# Patient Record
Sex: Female | Born: 1970 | Race: White | Hispanic: No | Smoking: Current some day smoker
Health system: Southern US, Community
[De-identification: ages and names within clinical notes are randomized; demographics above are authoritative.]

## PROBLEM LIST (undated history)

## (undated) DIAGNOSIS — M722 Plantar fascial fibromatosis: Secondary | ICD-10-CM

## (undated) DIAGNOSIS — C50919 Malignant neoplasm of unspecified site of unspecified female breast: Secondary | ICD-10-CM

## (undated) DIAGNOSIS — E039 Hypothyroidism, unspecified: Secondary | ICD-10-CM

## (undated) DIAGNOSIS — Z806 Family history of leukemia: Secondary | ICD-10-CM

## (undated) DIAGNOSIS — E079 Disorder of thyroid, unspecified: Secondary | ICD-10-CM

## (undated) DIAGNOSIS — F419 Anxiety disorder, unspecified: Secondary | ICD-10-CM

## (undated) DIAGNOSIS — E162 Hypoglycemia, unspecified: Secondary | ICD-10-CM

## (undated) DIAGNOSIS — G43909 Migraine, unspecified, not intractable, without status migrainosus: Secondary | ICD-10-CM

## (undated) HISTORY — DX: Plantar fascial fibromatosis: M72.2

## (undated) HISTORY — PX: NO PAST SURGERIES: SHX2092

## (undated) HISTORY — DX: Disorder of thyroid, unspecified: E07.9

## (undated) HISTORY — DX: Family history of leukemia: Z80.6

## (undated) HISTORY — DX: Anxiety disorder, unspecified: F41.9

## (undated) HISTORY — DX: Hypoglycemia, unspecified: E16.2

## (undated) HISTORY — PX: BREAST BIOPSY: SHX20

## (undated) HISTORY — DX: Migraine, unspecified, not intractable, without status migrainosus: G43.909

---

## 2013-05-03 ENCOUNTER — Encounter: Payer: Self-pay | Admitting: Certified Nurse Midwife

## 2013-05-03 ENCOUNTER — Ambulatory Visit (INDEPENDENT_AMBULATORY_CARE_PROVIDER_SITE_OTHER): Payer: BC Managed Care – PPO | Admitting: Certified Nurse Midwife

## 2013-05-03 VITALS — BP 120/78 | HR 68 | Resp 16 | Ht 61.75 in | Wt 171.0 lb

## 2013-05-03 DIAGNOSIS — Z0000 Encounter for general adult medical examination without abnormal findings: Secondary | ICD-10-CM

## 2013-05-03 DIAGNOSIS — Z01419 Encounter for gynecological examination (general) (routine) without abnormal findings: Secondary | ICD-10-CM

## 2013-05-03 DIAGNOSIS — E039 Hypothyroidism, unspecified: Secondary | ICD-10-CM | POA: Insufficient documentation

## 2013-05-03 LAB — LIPID PANEL
CHOL/HDL RATIO: 2.1 ratio
Cholesterol: 164 mg/dL (ref 0–200)
HDL: 80 mg/dL (ref >39)
LDL CALC: 69 mg/dL (ref 0–99)
Triglycerides: 77 mg/dL (ref <150)
VLDL: 15 mg/dL (ref 0–40)

## 2013-05-03 LAB — THYROID PANEL WITH TSH
Free Thyroxine Index: 0.6 — ABNORMAL LOW (ref 1.0–3.9)
T3 UPTAKE: 30.8 % (ref 22.5–37.0)
T4 TOTAL: 1.9 ug/dL — AB (ref 5.0–12.5)
TSH: 83.858 u[IU]/mL — ABNORMAL HIGH (ref 0.350–4.500)

## 2013-05-03 LAB — HEMOGLOBIN, FINGERSTICK: HEMOGLOBIN, FINGERSTICK: 13.1 g/dL (ref 12.0–16.0)

## 2013-05-03 NOTE — Patient Instructions (Signed)

## 2013-05-03 NOTE — Progress Notes (Signed)
43 y.o. G1P1001 Single Caucasian Fe here to establish gyn care and for annual exam.Periods normal no issues. No contraception use female/female relationship. Hypothyroid currently not on medication due to move to area for the past year. Patient symptomatic with fatigue and hair loss and weight change.Patient was on Synthroid. Patient would like to level check to see what dosage she needs now. Previous history of migraine with aura, but has not had one in several years. Used OTC medication for relief. Concerns with PMS issue in past few months, feels like it might be my thyroid instead. Denies anxiety issues. Sees Urgent care prn.  Patient's last menstrual period was 04/26/2013.          Sexually active: yes  The current method of family planning is none.    Exercising: no  exercise Smoker:  yes  Health Maintenance: Pap:  2013 normal per patient No abnormal paps MMG:  none Colonoscopy:  none BMD:   none TDaP: unsure Labs: Hgb-13.1   reports that she has been smoking.  She does not have any smokeless tobacco history on file. She reports that she drinks about 1.5 - 2 ounces of alcohol per week. She reports that she does not use illicit drugs.  Past Medical History  Diagnosis Date  . Migraines   . Anxiety   . Thyroid disease     hypothyroid  . Hypoglycemia     History reviewed. No pertinent past surgical history.  No current outpatient prescriptions on file.   No current facility-administered medications for this visit.    Family History  Problem Relation Age of Onset  . Thyroid disease Father   . Heart attack Maternal Aunt   . Heart disease Maternal Grandmother     ROS:  Pertinent items are noted in HPI.  Otherwise, a comprehensive ROS was negative.  Exam:   BP 120/78  Pulse 68  Resp 16  Ht 5' 1.75" (1.568 m)  Wt 171 lb (77.565 kg)  BMI 31.55 kg/m2  LMP 04/26/2013 Height: 5' 1.75" (156.8 cm)  Ht Readings from Last 3 Encounters:  05/03/13 5' 1.75" (1.568 m)     General appearance: alert, cooperative and appears stated age Head: Normocephalic, without obvious abnormality, atraumatic Neck: no adenopathy, supple, symmetrical, trachea midline and thyroid mild enlargement, no nodules palpated Lungs: clear to auscultation bilaterally Breasts: normal appearance, no masses or tenderness, No nipple retraction or dimpling, No nipple discharge or bleeding, No axillary or supraclavicular adenopathy Heart: regular rate and rhythm Abdomen: soft, non-tender; no masses,  no organomegaly Extremities: extremities normal, atraumatic, no cyanosis or edema Skin: Skin color, texture, turgor normal. No rashes or lesions Lymph nodes: Cervical, supraclavicular, and axillary nodes normal. No abnormal inguinal nodes palpated Neurologic: Grossly normal   Pelvic: External genitalia:  no lesions              Urethra:  normal appearing urethra with no masses, tenderness or lesions              Bartholin's and Skene's: normal                 Vagina: normal appearing vagina with normal color and discharge, no lesions              Cervix: normal, non tender              Pap taken: yes Bimanual Exam:  Uterus:  normal size, contour, position, consistency, mobility, non-tender and anteverted  Adnexa: normal adnexa and no mass, fullness, tenderness               Rectovaginal: Confirms               Anus:  normal sphincter tone, no lesions  A:  Well Woman with normal exam  Contraception none needed female/female relationship  Hypothyroid off medication due no established care  History of anxiety in past no medication needed now  History of migraine no aura with OTC medication use only  ?PMS  P:   Reviewed health and wellness pertinent to exam  Discussed obtaining TSH and thyroid panel to assess status and then will work with dosage. May need to sue Endocrine if problems. Discussed PMS, may be thyroid and will address PMS if indicated. Patient agreeable with  plan.  Labs:Lipid panel, TSH with thyroid panel, Vit.D  Pap smear as per guidelines   Mammogram yearly, given information to schedule first one pap smear taken today with HPVHR  counseled on breast self exam, mammography screening, adequate intake of calcium and vitamin D, diet and exercise  return annually or prn  An After Visit Summary was printed and given to the patient.

## 2013-05-04 LAB — VITAMIN D 25 HYDROXY (VIT D DEFICIENCY, FRACTURES): Vit D, 25-Hydroxy: 29 ng/mL — ABNORMAL LOW (ref 30–89)

## 2013-05-05 LAB — IPS PAP TEST WITH HPV

## 2013-05-06 ENCOUNTER — Telehealth: Payer: Self-pay

## 2013-05-06 ENCOUNTER — Other Ambulatory Visit: Payer: Self-pay | Admitting: Certified Nurse Midwife

## 2013-05-06 DIAGNOSIS — E039 Hypothyroidism, unspecified: Secondary | ICD-10-CM

## 2013-05-06 MED ORDER — LEVOTHYROXINE SODIUM 125 MCG PO TABS: 125.0000 ug | ORAL_TABLET | Freq: Every day | ORAL | Status: DC

## 2013-05-06 NOTE — Telephone Encounter (Signed)
Patient notified of results. See lab 

## 2013-05-06 NOTE — Telephone Encounter (Signed)
lmtcb

## 2013-05-06 NOTE — Telephone Encounter (Signed)
Message copied by Susy Manor on Fri May 06, 2013  3:54 PM ------      Message from: Regina Eck      Created: Fri May 06, 2013  8:35 AM       Notify patient Vitamin D low, needs to start on OTC Vit.D3 1000 IU daily        TSH abnormal as we suspected, which is probably contributing to her symptoms  Needs to start on RX Synthroid 125 mcg daily (per consult with Dr. Sabra Heck) sent order to pharmacy and recheck in 2 weeks order in      Cholesterol normal HDL great, and LDL normal, Triglycerides normal      Pap smear normal, HPVHR not detected 02 ------

## 2013-05-13 NOTE — Progress Notes (Signed)
Reviewed personally.  M. Suzanne Tramel Westbrook, MD.  

## 2013-05-20 ENCOUNTER — Other Ambulatory Visit (INDEPENDENT_AMBULATORY_CARE_PROVIDER_SITE_OTHER): Payer: BC Managed Care – PPO

## 2013-05-20 DIAGNOSIS — E039 Hypothyroidism, unspecified: Secondary | ICD-10-CM

## 2013-05-21 LAB — THYROID PANEL WITH TSH
Free Thyroxine Index: 3.9 (ref 1.0–3.9)
T3 Uptake: 40.3 % — ABNORMAL HIGH (ref 22.5–37.0)
T4 TOTAL: 9.8 ug/dL (ref 5.0–12.5)
TSH: 16.878 u[IU]/mL — ABNORMAL HIGH (ref 0.350–4.500)

## 2013-05-23 ENCOUNTER — Telehealth: Payer: Self-pay

## 2013-05-23 ENCOUNTER — Other Ambulatory Visit: Payer: Self-pay | Admitting: Certified Nurse Midwife

## 2013-05-23 DIAGNOSIS — E039 Hypothyroidism, unspecified: Secondary | ICD-10-CM

## 2013-05-23 NOTE — Telephone Encounter (Signed)
Patient notified of results as written by provider 

## 2013-05-23 NOTE — Telephone Encounter (Signed)
lmtcb

## 2013-05-23 NOTE — Telephone Encounter (Signed)
Returning call. Ok to call work number [phone].

## 2013-05-23 NOTE — Telephone Encounter (Signed)
Message copied by Susy Manor on Mon May 23, 2013  9:56 AM ------      Message from: Regina Eck      Created: Mon May 23, 2013  8:55 AM       Notify patient thyroid panel much better. Continue current dosage. Recheck in one month. Order in      Patient status? ------

## 2013-06-24 ENCOUNTER — Other Ambulatory Visit: Payer: BC Managed Care – PPO

## 2013-07-01 ENCOUNTER — Ambulatory Visit (INDEPENDENT_AMBULATORY_CARE_PROVIDER_SITE_OTHER): Payer: BC Managed Care – PPO | Admitting: *Deleted

## 2013-07-01 DIAGNOSIS — E039 Hypothyroidism, unspecified: Secondary | ICD-10-CM

## 2013-07-01 LAB — THYROID PANEL WITH TSH
FREE THYROXINE INDEX: 4.6 — AB (ref 1.0–3.9)
T3 Uptake: 43.4 % — ABNORMAL HIGH (ref 22.5–37.0)
T4, Total: 10.5 ug/dL (ref 5.0–12.5)
TSH: 0.501 u[IU]/mL (ref 0.350–4.500)

## 2013-07-03 ENCOUNTER — Other Ambulatory Visit: Payer: Self-pay | Admitting: Certified Nurse Midwife

## 2013-07-04 NOTE — Telephone Encounter (Signed)
Patient to continue with dose? Last TSH check 07/01/13

## 2013-07-05 MED ORDER — LEVOTHYROXINE SODIUM 125 MCG PO TABS: 125.0000 ug | ORAL_TABLET | Freq: Every day | ORAL | Status: DC

## 2013-07-05 NOTE — Telephone Encounter (Signed)
Patient to continue current dose and recheck in 6 weeks Order in for labs and rx

## 2013-07-06 ENCOUNTER — Telehealth: Payer: Self-pay

## 2013-07-06 NOTE — Telephone Encounter (Signed)
Patient notified of results. See lab 

## 2013-07-06 NOTE — Telephone Encounter (Signed)
lmtcb

## 2013-07-06 NOTE — Telephone Encounter (Signed)
Called pt and was notified. Pt made Lab appt for 08/12/13 @3 .30p Rx sent.

## 2013-07-06 NOTE — Telephone Encounter (Signed)
Message copied by Susy Manor on Wed Jul 06, 2013 12:56 PM ------      Message from: Regina Eck      Created: Tue Jul 05, 2013  6:39 PM       Notify patient that profile looking much better. Would like to keep on current dosage and recheck in 6 weeks. Order in for medication and labs ------

## 2013-08-12 ENCOUNTER — Ambulatory Visit (INDEPENDENT_AMBULATORY_CARE_PROVIDER_SITE_OTHER): Payer: BC Managed Care – PPO

## 2013-08-12 DIAGNOSIS — E039 Hypothyroidism, unspecified: Secondary | ICD-10-CM

## 2013-08-13 LAB — THYROID PANEL WITH TSH
Free Thyroxine Index: 4.8 — ABNORMAL HIGH (ref 1.0–3.9)
T3 UPTAKE: 42.5 % — AB (ref 22.5–37.0)
T4, Total: 11.3 ug/dL (ref 5.0–12.5)
TSH: 0.187 u[IU]/mL — AB (ref 0.350–4.500)

## 2013-08-17 ENCOUNTER — Telehealth: Payer: Self-pay

## 2013-08-17 NOTE — Telephone Encounter (Signed)
Message copied by Susy Manor on Wed Aug 17, 2013 11:52 AM ------      Message from: Regina Eck      Created: Wed Aug 17, 2013  8:45 AM       Notify patient that her TSH is low and no thyroid medication at this time      Your T3 and Free Thyroxine are slightly high as result of TSH being low      T4 normal      Needs repeat in one month to see if normalized order in ------

## 2013-08-17 NOTE — Telephone Encounter (Signed)
lmtcb

## 2013-08-18 NOTE — Telephone Encounter (Signed)
Patient notified of results as written by provider 

## 2013-08-18 NOTE — Telephone Encounter (Signed)
Patient is returning call to joy

## 2013-08-31 ENCOUNTER — Other Ambulatory Visit: Payer: Self-pay

## 2013-08-31 DIAGNOSIS — E039 Hypothyroidism, unspecified: Secondary | ICD-10-CM

## 2013-08-31 NOTE — Telephone Encounter (Signed)
Last AEX:05/03/13 Last refill:07/06/13 #30 X 1 Current AEX:08/26/14  Next Lab appt: 09/16/13   Message from: Regina Eck  Created: Wed Aug 17, 2013 8:45 AM  Notify patient that her TSH is low and no thyroid medication at this time  Your T3 and Free Thyroxine are slightly high as result of TSH being low  T4 normal  Needs repeat in one month to see if normalized order in

## 2013-09-01 ENCOUNTER — Other Ambulatory Visit: Payer: Self-pay | Admitting: Certified Nurse Midwife

## 2013-09-01 NOTE — Telephone Encounter (Signed)
Last AEX 05/03/13 Last refill 07/05/13 #30/1 refill Last TSH check 08/12/13  Please approve or deny Rx.

## 2013-09-05 MED ORDER — LEVOTHYROXINE SODIUM 125 MCG PO TABS: 125.0000 ug | ORAL_TABLET | Freq: Every day | ORAL | Status: DC

## 2013-09-16 ENCOUNTER — Ambulatory Visit (INDEPENDENT_AMBULATORY_CARE_PROVIDER_SITE_OTHER): Payer: BC Managed Care – PPO

## 2013-09-16 DIAGNOSIS — E039 Hypothyroidism, unspecified: Secondary | ICD-10-CM

## 2013-09-16 LAB — TSH: TSH: 2.616 u[IU]/mL (ref 0.350–4.500)

## 2013-09-20 ENCOUNTER — Telehealth: Payer: Self-pay

## 2013-09-20 NOTE — Telephone Encounter (Signed)
Message copied by Susy Manor on Tue Sep 20, 2013  2:34 PM ------      Message from: Regina Eck      Created: Tue Sep 20, 2013 12:00 PM       Notify patient that her TSH looks good and has leveled out. Will keep at same dosage. Recheck 4 months order in. Does she need refill on her medication? ------

## 2013-09-20 NOTE — Telephone Encounter (Signed)
Unable to leave message on answering machine. Try again

## 2013-09-29 NOTE — Telephone Encounter (Signed)
Patient notified of results. See lab 

## 2013-10-12 ENCOUNTER — Telehealth: Payer: Self-pay | Admitting: Certified Nurse Midwife

## 2013-10-12 NOTE — Telephone Encounter (Signed)
Spoke with patient. Results given as seen below. Patient is agreeable. Patient has one refill of medication and will call back when she needs another refill. Four month recheck of TSH scheduled for 1/22 at 3:30pm. Patient agreeable to date and time.  Notes Recorded by Regina Eck, CNM on 09/20/2013 at 12:00 PM Notify patient that her TSH looks good and has leveled out. Will keep at same dosage. Recheck 4 months order in. Does she need refill on her medication?   Routing to provider for final review. Patient agreeable to disposition. Will close encounter

## 2013-10-12 NOTE — Telephone Encounter (Signed)
Pt wondering if she should stay on her current thyroid medication or if a new Rx was going to be sent to her pharmacy.

## 2013-11-09 ENCOUNTER — Other Ambulatory Visit: Payer: Self-pay | Admitting: Certified Nurse Midwife

## 2013-11-09 NOTE — Telephone Encounter (Signed)
Incoming [facility]: Synthroid 174mcg  Last AEX:05/03/13 Last Refill:09/05/13 #30 X 1 Next AEX:05/26/14 Last TSH:09/16/13, 2.616  Please Advise

## 2013-11-21 ENCOUNTER — Encounter: Payer: Self-pay | Admitting: Certified Nurse Midwife

## 2014-02-03 ENCOUNTER — Other Ambulatory Visit (INDEPENDENT_AMBULATORY_CARE_PROVIDER_SITE_OTHER): Payer: BLUE CROSS/BLUE SHIELD

## 2014-02-03 DIAGNOSIS — E039 Hypothyroidism, unspecified: Secondary | ICD-10-CM

## 2014-02-03 DIAGNOSIS — D649 Anemia, unspecified: Secondary | ICD-10-CM

## 2014-02-03 LAB — TSH: TSH: 0.497 u[IU]/mL (ref 0.350–4.500)

## 2014-02-06 ENCOUNTER — Telehealth: Payer: Self-pay

## 2014-02-06 NOTE — Telephone Encounter (Signed)
lmtcb

## 2014-02-06 NOTE — Telephone Encounter (Signed)
-----   Message from Regina Eck, CNM sent at 02/05/2014  6:16 PM EST ----- Notify TSH looks good on current dose, does she need refill on medication?

## 2014-02-07 NOTE — Telephone Encounter (Signed)
Patient notified of results. See lab 

## 2014-02-10 ENCOUNTER — Other Ambulatory Visit: Payer: BC Managed Care – PPO

## 2014-03-19 ENCOUNTER — Other Ambulatory Visit: Payer: Self-pay | Admitting: Certified Nurse Midwife

## 2014-03-20 NOTE — Telephone Encounter (Signed)
Medication refill request: Synthroid 125 mcg Last AEX:  05/03/13 DL Next AEX: 05/26/14 DL Last MMG (if hormonal medication request): none Refill authorized: 11/09/13 #30/3R. Today #30/3R

## 2014-05-26 ENCOUNTER — Ambulatory Visit (INDEPENDENT_AMBULATORY_CARE_PROVIDER_SITE_OTHER): Payer: Managed Care, Other (non HMO) | Admitting: Certified Nurse Midwife

## 2014-05-26 ENCOUNTER — Encounter: Payer: Self-pay | Admitting: Certified Nurse Midwife

## 2014-05-26 VITALS — BP 110/68 | HR 74 | Resp 16 | Ht 62.25 in | Wt 174.0 lb

## 2014-05-26 DIAGNOSIS — Z01419 Encounter for gynecological examination (general) (routine) without abnormal findings: Secondary | ICD-10-CM | POA: Diagnosis not present

## 2014-05-26 DIAGNOSIS — E039 Hypothyroidism, unspecified: Secondary | ICD-10-CM

## 2014-05-26 DIAGNOSIS — N644 Mastodynia: Secondary | ICD-10-CM

## 2014-05-26 DIAGNOSIS — E559 Vitamin D deficiency, unspecified: Secondary | ICD-10-CM | POA: Diagnosis not present

## 2014-05-26 MED ORDER — SYNTHROID 125 MCG PO TABS: 125.0000 ug | ORAL_TABLET | Freq: Every day | ORAL | Status: DC

## 2014-05-26 NOTE — Progress Notes (Signed)
Appointment scheduled while patient in office for bilateral diagnostic mammogram [facility] for [facility]20 at 3:10pm. Patient is agreeable to date and time.

## 2014-05-26 NOTE — Progress Notes (Signed)
44 y.o. G1P1001 Single  Caucasian Fe here for annual exam.  Periods normal, no issues. Contraception not needed. Complaining of left breast tenderness on outer quadrant. Wears underwire bra and feels this may be cause, not related to menses or lifting or sexual activity. Has never had mammogram.. Denies nipple discharge, skin change, or lumps. Sees PCP prn., no other health issues today.  Patient's last menstrual period was 05/20/2014.          Sexually active: Yes.    The current method of family planning is none, female partner.    Exercising: No.  exercise Smoker:  no  Health Maintenance: Pap: 05-03-13 neg HPV HR neg MMG:  none Colonoscopy:  none BMD:   none TDaP:  UTD Labs: none Self breast exam: done occ   reports that she has quit smoking. She does not have any smokeless tobacco history on file. She reports that she drinks about 1.2 - 1.8 oz of alcohol per week. She reports that she does not use illicit drugs.  Past Medical History  Diagnosis Date  . Migraines   . Anxiety   . Thyroid disease     hypothyroid  . Hypoglycemia     History reviewed. No pertinent past surgical history.  Current Outpatient Prescriptions  Medication Sig Dispense Refill  . SYNTHROID 125 MCG tablet TAKE 1 TABLET BY MOUTH EVERY DAY BEFORE BREAKFAST 30 tablet 3   No current facility-administered medications for this visit.    Family History  Problem Relation Age of Onset  . Thyroid disease Father   . Heart attack Maternal Aunt   . Heart disease Maternal Grandmother     ROS:  Pertinent items are noted in HPI.  Otherwise, a comprehensive ROS was negative.  Exam:   BP 110/68 mmHg  Pulse 74  Resp 16  Ht 5' 2.25" (1.581 m)  Wt 174 lb (78.926 kg)  BMI 31.58 kg/m2  LMP 05/20/2014 Height: 5' 2.25" (158.1 cm) Ht Readings from Last 3 Encounters:  05/26/14 5' 2.25" (1.581 m)  05/03/13 5' 1.75" (1.568 m)    General appearance: alert, cooperative and appears stated age Head: Normocephalic,  without obvious abnormality, atraumatic Neck: no adenopathy, supple, symmetrical, trachea midline and thyroid normal to inspection and palpation Lungs: clear to auscultation bilaterally Breasts: normal appearance, no masses or tenderness, No nipple retraction or dimpling, No nipple discharge or bleeding, No axillary or supraclavicular adenopathy positive finding on left breast of small ? Scratch or underwire bra compression on breast outer edge on axillary side, shown to patient, feels like it is a scratch, non tender Heart: regular rate and rhythm Abdomen: soft, non-tender; no masses,  no organomegaly Extremities: extremities normal, atraumatic, no cyanosis or edema Skin: Skin color, texture, turgor normal. No rashes or lesions Lymph nodes: Cervical, supraclavicular, and axillary nodes normal. No abnormal inguinal nodes palpated Neurologic: Grossly normal   Pelvic: External genitalia:  no lesions              Urethra:  normal appearing urethra with no masses, tenderness or lesions              Bartholin's and Skene's: normal                 Vagina: normal appearing vagina with normal color and discharge, no lesions              Cervix: normal,non tender, no lesions              Pap taken:  No. Bimanual Exam:  Uterus:  normal size, contour, position, consistency, mobility, non-tender              Adnexa: normal adnexa and no mass, fullness, tenderness               Rectovaginal: Confirms               Anus:  normal sphincter tone, no lesions  Chaperone present: Yes  A:  Well Woman with normal   Contraception none needed female partner  Hypothyroid stable medication   Left breast pain, sporadic, small red area of ? Scratch or underwire compression,non tender, not area of concern  P:   Reviewed health and wellness pertinent to exam  Rx Synthroid 125 mcg see order, recheck in 8/16 with Vitamin D  Discussed scheduling diagnostic mammogram with possible [place] to evaluate and screen on right  breast. Agreeable. Will schedule today  Pap smear not taken today   counseled on breast self exam, mammography screening, STD prevention, adequate intake of calcium and vitamin D, diet and exercise  return annually or prn  An After Visit Summary was printed and given to the patient.

## 2014-05-26 NOTE — Patient Instructions (Signed)

## 2014-05-27 NOTE — Progress Notes (Signed)
Reviewed personally.  M. Suzanne Ayonna Speranza, MD.  

## 2014-06-02 ENCOUNTER — Other Ambulatory Visit: Payer: Self-pay

## 2014-06-09 ENCOUNTER — Ambulatory Visit
Admission: RE | Admit: 2014-06-09 | Discharge: 2014-06-09 | Disposition: A | Discharge status: Home / Self Care | Payer: Commercial Indemnity | Source: Ambulatory Visit | Attending: Certified Nurse Midwife | Admitting: Certified Nurse Midwife

## 2014-06-09 DIAGNOSIS — N644 Mastodynia: Secondary | ICD-10-CM

## 2014-06-15 ENCOUNTER — Telehealth: Payer: Self-pay | Admitting: Emergency Medicine

## 2014-06-15 NOTE — Telephone Encounter (Signed)
Called patient. She is agreeable to breast check, however, can only take a Friday appointment and will be out [place] for most of June. She is scheduled for first available time that will work for her schedule for 07/21/14 with Regina Eck CNM. Patient agreeable. She will return call with any worsening or concerning symptoms. Routing to provider for final review. Patient agreeable to disposition. Will close encounter.

## 2014-06-15 NOTE — Telephone Encounter (Signed)
-----   Message from Megan Salon, MD sent at 06/14/2014  3:16 PM EDT ----- Yes out of MMG hold.  I'm sending back to Debbi to make sure she doesn't want to have pt return for recheck.  Thanks.

## 2014-07-21 ENCOUNTER — Ambulatory Visit: Payer: Managed Care, Other (non HMO) | Admitting: Certified Nurse Midwife

## 2014-07-21 ENCOUNTER — Telehealth: Payer: Self-pay | Admitting: Certified Nurse Midwife

## 2014-07-21 NOTE — Telephone Encounter (Signed)
Patient canceled her appointment "breast reck" and rescheduled to 09/15/14 @ 4:00. Patient is stuck out of town and will not get back in time for appointment.

## 2014-07-21 NOTE — Telephone Encounter (Signed)
Needs to be in recall due previous breast issue ad this follow up

## 2014-07-25 NOTE — Telephone Encounter (Signed)
04 mammo recall in for 8/16

## 2014-07-25 NOTE — Telephone Encounter (Signed)
Joy, please see previous msg.

## 2014-07-27 ENCOUNTER — Other Ambulatory Visit: Payer: Self-pay | Admitting: Nurse Practitioner

## 2014-07-27 NOTE — Telephone Encounter (Signed)
05/26/14 #30/3 [facility] [place]; patient is coming in for tsh/vitamin d recheck 09/15/14. Patient should still have one more refill.- RX denied.

## 2014-08-18 ENCOUNTER — Other Ambulatory Visit: Payer: Self-pay | Admitting: Certified Nurse Midwife

## 2014-08-18 NOTE — Telephone Encounter (Signed)
Called pharmacy they have one refill left. They will get it ready and call patient when it's ready.  Patient notified.

## 2014-08-18 NOTE — Telephone Encounter (Signed)
Medication refill request: Synthroid  Last AEX:  05/26/14 DL Next AEX: 06/01/15 DL Last MMG (if hormonal medication request): [place] Left BIRADS1:neg Refill authorized: 05/26/14 #30tabs/ 3R.   Called patient, she should have 1 more refill to last until 08/2014 for her recheck. Patient verbalized understanding.

## 2014-08-18 NOTE — Telephone Encounter (Signed)
Patient is requesting a refill for synthroid. [facility] [place] Patient states the pharmacy said she does not have any more refills.

## 2014-08-18 NOTE — Telephone Encounter (Signed)
Patient need a refill on her synthroid [facility] [place] at [phone].

## 2014-08-21 ENCOUNTER — Other Ambulatory Visit: Payer: Self-pay | Admitting: Nurse Practitioner

## 2014-08-21 ENCOUNTER — Other Ambulatory Visit: Payer: Self-pay | Admitting: Certified Nurse Midwife

## 2014-08-21 NOTE — Telephone Encounter (Signed)
Medication refill request: Synthroid Last AEX:  05-26-14 Next AEX: 06-01-15 Last MMG (if hormonal medication request): 06-09-14 WNL Refill authorized: PLease advise

## 2014-09-15 ENCOUNTER — Telehealth: Payer: Self-pay | Admitting: Certified Nurse Midwife

## 2014-09-15 ENCOUNTER — Other Ambulatory Visit: Payer: Managed Care, Other (non HMO)

## 2014-09-15 ENCOUNTER — Ambulatory Visit: Payer: Managed Care, Other (non HMO) | Admitting: Certified Nurse Midwife

## 2014-09-15 NOTE — Telephone Encounter (Signed)
Patient canceled her lab and breast recheck appointments for today.

## 2014-09-19 NOTE — Telephone Encounter (Signed)
Please call regarding missed appointment

## 2014-09-27 NOTE — Telephone Encounter (Signed)
lmtcb & schedule appt

## 2014-09-29 NOTE — Telephone Encounter (Signed)
Left message for patient to schedule appt

## 2014-10-03 NOTE — Telephone Encounter (Signed)
No callback from patient after several attempts. Please advise.

## 2014-10-17 NOTE — Telephone Encounter (Signed)
Patient will need refill on Thyroid med. In 10/16. I feel her breast issues have resolved or she would have called and come in. Will not schedule again for this unless she calls. Will wait until she calls for refill on medication for lab schedule.

## 2014-12-01 ENCOUNTER — Encounter: Payer: Self-pay | Admitting: Certified Nurse Midwife

## 2014-12-01 ENCOUNTER — Ambulatory Visit (INDEPENDENT_AMBULATORY_CARE_PROVIDER_SITE_OTHER): Payer: 59 | Admitting: Certified Nurse Midwife

## 2014-12-01 VITALS — BP 118/74 | HR 70 | Resp 16 | Ht 62.25 in | Wt 193.0 lb

## 2014-12-01 DIAGNOSIS — N644 Mastodynia: Secondary | ICD-10-CM

## 2014-12-01 NOTE — Patient Instructions (Signed)

## 2014-12-01 NOTE — Progress Notes (Signed)
   Subjective:   44 y.o. Married Caucasian female presents for follow up of left breast tenderness. Patient was seen 5/16 for aex with breast tenderness at 3 o'clock on left breast of unknown etiology. No injury or trauma or masses were noted. Diagnostic mammogram and [place] were done on 06/09/14 which were normal. Patient felt this tenderness spontaneously resolved ? About 1-2 weeks later and has had no further tenderness of either breast. Doing SBE with no changes noted. Patient also here for repeat lab over due for Vitamin D and TSH. Hypothyroid and medication working well, no issues.  Review of Systems Pertinent items are noted in HPI.   Objective:   General appearance: alert, cooperative, appears stated age and no distress Breasts: normal appearance, no masses or tenderness, No nipple retraction or dimpling, No nipple discharge or bleeding, No axillary or supraclavicular adenopathy, no tenderness noted in area of concern at 3 o'clock on left breast.   Assessment:   ASSESSMENT:Patient is diagnosed with normal breast exam  Hypothyroid on stable medication, here for recheck lab TSH Vitamin D deficiency here for recheck lab   Plan:   PLAN: Discussed normal breast exam findings bilateral. Questions addressed regarding bra use and exercise. Discussed supportive bra needed, but not overly tight. Patient will make sure no issues with. Instructed to advise if any breast changes or tenderness reoccurrence. Lab: TSH, Vitamin D  Rv prn, aex

## 2014-12-02 NOTE — Progress Notes (Signed)
Reviewed personally.  M. Suzanne Samay Delcarlo, MD.  

## 2014-12-20 ENCOUNTER — Other Ambulatory Visit: Payer: Self-pay | Admitting: Certified Nurse Midwife

## 2014-12-20 NOTE — Telephone Encounter (Deleted)
Called and spoke to patient to schedule for Labs (TSH / Vit D).  She is scheduled for Friday 12/ 02/2014) afternoon.

## 2014-12-20 NOTE — Telephone Encounter (Signed)
Scheduled patient to come in for Labs (TSH / Vit D).  Will route Rx to DL once labs are done //kg

## 2014-12-22 ENCOUNTER — Other Ambulatory Visit (INDEPENDENT_AMBULATORY_CARE_PROVIDER_SITE_OTHER): Payer: 59

## 2014-12-22 DIAGNOSIS — E039 Hypothyroidism, unspecified: Secondary | ICD-10-CM

## 2014-12-22 DIAGNOSIS — E559 Vitamin D deficiency, unspecified: Secondary | ICD-10-CM

## 2014-12-22 LAB — TSH: TSH: 1.034 u[IU]/mL (ref 0.350–4.500)

## 2014-12-23 LAB — VITAMIN D 25 HYDROXY (VIT D DEFICIENCY, FRACTURES): Vit D, 25-Hydroxy: 30 ng/mL (ref 30–100)

## 2014-12-25 NOTE — Telephone Encounter (Signed)
Medication refill request: Synthroid Last AEX:  05/26/14 DL Next AEX: 06/01/15 DL Last MMG (if hormonal medication request): 06/09/14 BI-RADS Category 1:Negative Refill authorized: 08/22/14 #30 tablets.1Refills. Please advise

## 2014-12-26 MED ORDER — LEVOTHYROXINE SODIUM 125 MCG PO TABS: ORAL_TABLET | ORAL | Status: DC

## 2014-12-26 NOTE — Telephone Encounter (Addendum)
Called pt to go over test results. Rx sent to [facility] in [place] same as prescribed previously and canceled in [place]. Pt says new pharmacy is more convenient for her.

## 2014-12-26 NOTE — Telephone Encounter (Signed)
Called [facility] in [place] and verified that prescription is in fact filled.

## 2014-12-26 NOTE — Addendum Note (Signed)
Addended by: Devra Dopp D on: 12/26/2014 03:54 PM   Modules accepted: Orders

## 2014-12-26 NOTE — Telephone Encounter (Signed)
I think this patient already has refills, please check with pharmacy

## 2014-12-26 NOTE — Telephone Encounter (Signed)
Patient calling for results to be sure Rx is correct for her levels.

## 2015-06-01 ENCOUNTER — Ambulatory Visit (INDEPENDENT_AMBULATORY_CARE_PROVIDER_SITE_OTHER): Payer: 59 | Admitting: Certified Nurse Midwife

## 2015-06-01 ENCOUNTER — Encounter: Payer: Self-pay | Admitting: Certified Nurse Midwife

## 2015-06-01 VITALS — BP 110/70 | HR 72 | Resp 16 | Ht 61.5 in | Wt 184.0 lb

## 2015-06-01 DIAGNOSIS — Z01419 Encounter for gynecological examination (general) (routine) without abnormal findings: Secondary | ICD-10-CM | POA: Diagnosis not present

## 2015-06-01 DIAGNOSIS — E038 Other specified hypothyroidism: Secondary | ICD-10-CM | POA: Diagnosis not present

## 2015-06-01 DIAGNOSIS — Z0000 Encounter for general adult medical examination without abnormal findings: Secondary | ICD-10-CM | POA: Diagnosis not present

## 2015-06-01 DIAGNOSIS — Z124 Encounter for screening for malignant neoplasm of cervix: Secondary | ICD-10-CM

## 2015-06-01 NOTE — Patient Instructions (Signed)

## 2015-06-01 NOTE — Progress Notes (Signed)
45 y.o. G5P1001 Married  Caucasian Fe here for annual exam. Periods normal, no issues. Sees Urgent care prn. No more breast tenderness, was a bra problem. Thyroid medication working well. Due for recheck in 6/17 declined today. No health issues today. Declines labs today.  Patient's last menstrual period was 05/17/2015 (approximate).          Sexually active: Yes.    The current method of family planning is none, same sex partner.    Exercising: Yes.    Cardio, strength training Smoker:  no  Health Maintenance: Pap:  05/03/13 Neg. HR HPV:neg MMG:  06/09/14 [place] Left BIRADS1:neg TDaP:  Unsure greater than 10 years Hep C and HIV: Done HIV=Neg Labs: Not today   reports that she quit smoking about 4 years ago. She has never used smokeless tobacco. She reports that she drinks about 1.2 - 1.8 oz of alcohol per week. She reports that she does not use illicit drugs.  Past Medical History  Diagnosis Date  . Migraines   . Anxiety   . Thyroid disease     hypothyroid  . Hypoglycemia     History reviewed. No pertinent past surgical history.  Current Outpatient Prescriptions  Medication Sig Dispense Refill  . levothyroxine (SYNTHROID) 125 MCG tablet TAKE 1 TABLET (125 MCG TOTAL) BY MOUTH DAILY BEFORE BREAKFAST. 30 tablet 5   No current facility-administered medications for this visit.    Family History  Problem Relation Age of Onset  . Thyroid disease Father   . Leukemia Father   . Heart attack Maternal Aunt   . Heart disease Maternal Grandmother     ROS:  Pertinent items are noted in HPI.  Otherwise, a comprehensive ROS was negative.  Exam:   BP 110/70 mmHg  Pulse 72  Resp 16  Ht 5' 1.5" (1.562 m)  Wt 184 lb (83.462 kg)  BMI 34.21 kg/m2  LMP 05/17/2015 (Approximate) Height: 5' 1.5" (156.2 cm) Ht Readings from Last 3 Encounters:  06/01/15 5' 1.5" (1.562 m)  12/01/14 5' 2.25" (1.581 m)  05/26/14 5' 2.25" (1.581 m)    General appearance: alert, cooperative and appears stated  age Head: Normocephalic, without obvious abnormality, atraumatic Neck: no adenopathy, supple, symmetrical, trachea midline and thyroid normal to inspection and palpation Lungs: clear to auscultation bilaterally Breasts: normal appearance, no masses or tenderness, No nipple retraction or dimpling, No nipple discharge or bleeding, No axillary or supraclavicular adenopathy Heart: regular rate and rhythm Abdomen: soft, non-tender; no masses,  no organomegaly Extremities: extremities normal, atraumatic, no cyanosis or edema Skin: Skin color, texture, turgor normal. No rashes or lesions Lymph nodes: Cervical, supraclavicular, and axillary nodes normal. No abnormal inguinal nodes palpated Neurologic: Grossly normal   Pelvic: External genitalia:  no lesions              Urethra:  normal appearing urethra with no masses, tenderness or lesions              Bartholin's and Skene's: normal                 Vagina: normal appearing vagina with normal color and discharge, no lesions              Cervix: no cervical motion tenderness, no lesions and normal               Pap taken: Yes.   Bimanual Exam:  Uterus:  normal size, contour, position, consistency, mobility, non-tender  Adnexa: normal adnexa and no mass, fullness, tenderness               Rectovaginal: Confirms               Anus:  normal sphincter tone, no lesions  Chaperone present: yes  A:  Well Woman with normal exam  Contraception female partner  Hypothyroid stable medication, due for recheck 6/17  Immunization due     P:   Reviewed health and wellness pertinent to exam  Patient will schedule lab recheck and will do screening labs at that time of Lipid panel, CMP, Vitamin D, CBC, TSH  Declines TDAP today, reviewed risks and benefits  Pap smear as above with HPV reflex   counseled on breast self exam, mammography screening, adequate intake of calcium and vitamin D, diet and exercise  return annually or prn  An After  Visit Summary was printed and given to the patient.

## 2015-06-03 NOTE — Progress Notes (Signed)
Reviewed personally.  M. Suzanne Tosha Belgarde, MD.  

## 2015-06-07 LAB — IPS PAP TEST WITH REFLEX TO HPV

## 2015-07-11 ENCOUNTER — Other Ambulatory Visit: Payer: Self-pay | Admitting: Certified Nurse Midwife

## 2015-07-11 NOTE — Telephone Encounter (Signed)
Medication refill request: Synthroid 169mcg Last AEX:  06/01/15 DL Next AEX: 06/13/16 Last MMG (if hormonal medication request): 06/09/14 BIRADS1 negative See EPIC Refill authorized: 12/26/14 #30 w/5 refills; today 30 w/

## 2015-07-12 NOTE — Telephone Encounter (Signed)
She was to come in for recheck this month of TSH will need this for refill

## 2015-07-12 NOTE — Telephone Encounter (Signed)
Spoke with patient, she will be coming in tomorrow morning 07/12/15 at 8:40 for TSH lab

## 2015-07-13 ENCOUNTER — Other Ambulatory Visit (INDEPENDENT_AMBULATORY_CARE_PROVIDER_SITE_OTHER): Payer: 59

## 2015-07-13 DIAGNOSIS — Z0000 Encounter for general adult medical examination without abnormal findings: Secondary | ICD-10-CM

## 2015-07-13 DIAGNOSIS — E038 Other specified hypothyroidism: Secondary | ICD-10-CM

## 2015-07-13 LAB — COMPREHENSIVE METABOLIC PANEL
ALBUMIN: 3.7 g/dL (ref 3.6–5.1)
ALT: 12 U/L (ref 6–29)
AST: 11 U/L (ref 10–30)
Alkaline Phosphatase: 46 U/L (ref 33–115)
BILIRUBIN TOTAL: 0.4 mg/dL (ref 0.2–1.2)
BUN: 11 mg/dL (ref 7–25)
CO2: 22 mmol/L (ref 20–31)
CREATININE: 0.79 mg/dL (ref 0.50–1.10)
Calcium: 8.3 mg/dL — ABNORMAL LOW (ref 8.6–10.2)
Chloride: 108 mmol/L (ref 98–110)
Glucose, Bld: 95 mg/dL (ref 65–99)
Potassium: 4.2 mmol/L (ref 3.5–5.3)
SODIUM: 141 mmol/L (ref 135–146)
TOTAL PROTEIN: 6.2 g/dL (ref 6.1–8.1)

## 2015-07-13 LAB — CBC
HCT: 38.9 % (ref 35.0–45.0)
Hemoglobin: 12.7 g/dL (ref 11.7–15.5)
MCH: 28.8 pg (ref 27.0–33.0)
MCHC: 32.6 g/dL (ref 32.0–36.0)
MCV: 88.2 fL (ref 80.0–100.0)
MPV: 9 fL (ref 7.5–12.5)
PLATELETS: 311 10*3/uL (ref 140–400)
RBC: 4.41 MIL/uL (ref 3.80–5.10)
RDW: 14.4 % (ref 11.0–15.0)
WBC: 6.2 10*3/uL (ref 3.8–10.8)

## 2015-07-13 LAB — TSH: TSH: 2.07 mIU/L

## 2015-07-13 LAB — LIPID PANEL
Cholesterol: 131 mg/dL (ref 125–200)
HDL: 56 mg/dL (ref >=46)
LDL CALC: 61 mg/dL (ref <130)
TRIGLYCERIDES: 68 mg/dL (ref <150)
Total CHOL/HDL Ratio: 2.3 Ratio (ref <=5.0)
VLDL: 14 mg/dL (ref <30)

## 2015-07-14 LAB — VITAMIN D 25 HYDROXY (VIT D DEFICIENCY, FRACTURES): Vit D, 25-Hydroxy: 42 ng/mL (ref 30–100)

## 2015-07-17 ENCOUNTER — Telehealth: Payer: Self-pay

## 2015-07-17 ENCOUNTER — Other Ambulatory Visit: Payer: Self-pay | Admitting: Certified Nurse Midwife

## 2015-07-17 DIAGNOSIS — R899 Unspecified abnormal finding in specimens from other organs, systems and tissues: Secondary | ICD-10-CM

## 2015-07-17 DIAGNOSIS — E039 Hypothyroidism, unspecified: Secondary | ICD-10-CM

## 2015-07-17 MED ORDER — LEVOTHYROXINE SODIUM 125 MCG PO TABS: ORAL_TABLET | ORAL | Status: DC

## 2015-07-17 NOTE — Telephone Encounter (Signed)
lmtcb

## 2015-07-17 NOTE — Telephone Encounter (Signed)
-----   Message from Regina Eck, CNM sent at 07/17/2015  6:12 AM EDT ----- Notify patient Vitamin D normal, TSH normal looks good, Rx refill placed recheck in one year Liver, kidney,glucose profile normal except calcium slightly low, need to work on adding 600 mg of calcium supplement daily and calcium foods. Recheck in one month order placed Lipid profile normal CBC is normal

## 2015-07-19 NOTE — Telephone Encounter (Signed)
Mailbox full

## 2015-07-25 NOTE — Telephone Encounter (Signed)
Patient notified of results. See lab 

## 2015-07-25 NOTE — Telephone Encounter (Signed)
Left message for call back.

## 2015-08-22 ENCOUNTER — Other Ambulatory Visit (INDEPENDENT_AMBULATORY_CARE_PROVIDER_SITE_OTHER): Payer: 59

## 2015-08-22 DIAGNOSIS — R899 Unspecified abnormal finding in specimens from other organs, systems and tissues: Secondary | ICD-10-CM

## 2015-08-22 LAB — CALCIUM: CALCIUM: 8.8 mg/dL (ref 8.6–10.2)

## 2016-06-13 ENCOUNTER — Other Ambulatory Visit (HOSPITAL_COMMUNITY)
Admission: RE | Admit: 2016-06-13 | Discharge: 2016-06-13 | Disposition: A | Discharge status: Home / Self Care | Payer: 59 | Source: Ambulatory Visit | Attending: Certified Nurse Midwife | Admitting: Certified Nurse Midwife

## 2016-06-13 ENCOUNTER — Encounter: Payer: Self-pay | Admitting: Certified Nurse Midwife

## 2016-06-13 ENCOUNTER — Ambulatory Visit: Payer: 59 | Admitting: Certified Nurse Midwife

## 2016-06-13 ENCOUNTER — Ambulatory Visit (INDEPENDENT_AMBULATORY_CARE_PROVIDER_SITE_OTHER): Payer: 59 | Admitting: Certified Nurse Midwife

## 2016-06-13 VITALS — BP 98/62 | HR 68 | Resp 16 | Ht 61.5 in | Wt 173.0 lb

## 2016-06-13 DIAGNOSIS — Z1231 Encounter for screening mammogram for malignant neoplasm of breast: Secondary | ICD-10-CM | POA: Diagnosis not present

## 2016-06-13 DIAGNOSIS — Z01419 Encounter for gynecological examination (general) (routine) without abnormal findings: Secondary | ICD-10-CM | POA: Diagnosis not present

## 2016-06-13 DIAGNOSIS — Z124 Encounter for screening for malignant neoplasm of cervix: Secondary | ICD-10-CM | POA: Diagnosis present

## 2016-06-13 DIAGNOSIS — E039 Hypothyroidism, unspecified: Secondary | ICD-10-CM

## 2016-06-13 DIAGNOSIS — Z0000 Encounter for general adult medical examination without abnormal findings: Secondary | ICD-10-CM

## 2016-06-13 DIAGNOSIS — Z1239 Encounter for other screening for malignant neoplasm of breast: Secondary | ICD-10-CM

## 2016-06-13 LAB — CBC
HCT: 38.4 % (ref 35.0–45.0)
HEMOGLOBIN: 12.4 g/dL (ref 11.7–15.5)
MCH: 29.4 pg (ref 27.0–33.0)
MCHC: 32.3 g/dL (ref 32.0–36.0)
MCV: 91 fL (ref 80.0–100.0)
MPV: 8.9 fL (ref 7.5–12.5)
PLATELETS: 322 10*3/uL (ref 140–400)
RBC: 4.22 MIL/uL (ref 3.80–5.10)
RDW: 14.1 % (ref 11.0–15.0)
WBC: 8.2 10*3/uL (ref 3.8–10.8)

## 2016-06-13 LAB — TSH: TSH: 2.97 m[IU]/L

## 2016-06-13 NOTE — Patient Instructions (Signed)
EXERCISE AND DIET:  We recommended that you start or continue a regular exercise program for good health. Regular exercise means any activity that makes your heart beat faster and makes you sweat.  We recommend exercising at least 30 minutes per day at least 3 days a week, preferably 4 or 5.  We also recommend a diet low in fat and sugar.  Inactivity, poor dietary choices and obesity can cause diabetes, heart attack, stroke, and kidney damage, among others.    ALCOHOL AND SMOKING:  Women should limit their alcohol intake to no more than 7 drinks/beers/glasses of wine (combined, not each!) per week. Moderation of alcohol intake to this level decreases your risk of breast cancer and liver damage. And of course, no recreational drugs are part of a healthy lifestyle.  And absolutely no smoking or even second hand smoke. Most people know smoking can cause heart and lung diseases, but did you know it also contributes to weakening of your bones? Aging of your skin?  Yellowing of your teeth and nails?  CALCIUM AND VITAMIN D:  Adequate intake of calcium and Vitamin D are recommended.  The recommendations for exact amounts of these supplements seem to change often, but generally speaking 600 mg of calcium (either carbonate or citrate) and 800 units of Vitamin D per day seems prudent. Certain women may benefit from higher intake of Vitamin D.  If you are among these women, your doctor will have told you during your visit.    PAP SMEARS:  Pap smears, to check for cervical cancer or precancers,  have traditionally been done yearly, although recent scientific advances have shown that most women can have pap smears less often.  However, every woman still should have a physical exam from her gynecologist every year. It will include a breast check, inspection of the vulva and vagina to check for abnormal growths or skin changes, a visual exam of the cervix, and then an exam to evaluate the size and shape of the uterus and  ovaries.  And after 44 years of age, a rectal exam is indicated to check for rectal cancers. We will also provide age appropriate advice regarding health maintenance, like when you should have certain vaccines, screening for sexually transmitted diseases, bone density testing, colonoscopy, mammograms, etc.   MAMMOGRAMS:  All women over 40 years old should have a yearly mammogram. Many facilities now offer a "3D" mammogram, which may cost around $50 extra out of pocket. If possible,  we recommend you accept the option to have the 3D mammogram performed.  It both reduces the number of women who will be called back for extra views which then turn out to be normal, and it is better than the routine mammogram at detecting truly abnormal areas.    COLONOSCOPY:  Colonoscopy to screen for colon cancer is recommended for all women at age 50.  We know, you hate the idea of the prep.  We agree, BUT, having colon cancer and not knowing it is worse!!  Colon cancer so often starts as a polyp that can be seen and removed at colonscopy, which can quite literally save your life!  And if your first colonoscopy is normal and you have no family history of colon cancer, most women don't have to have it again for 10 years.  Once every ten years, you can do something that may end up saving your life, right?  We will be happy to help you get it scheduled when you are ready.    Be sure to check your insurance coverage so you understand how much it will cost.  It may be covered as a preventative service at no cost, but you should check your particular policy.      Mammogram A mammogram is an X-ray of the breasts that is done to check for changes that are not normal. This test can screen for and find any changes that may suggest breast cancer. This test can also help to find other changes and variations in the breast. What happens before the procedure?  Have this test done about 1-2 weeks after your period. This is usually when your  breasts are the least tender.  If you are visiting a new doctor or clinic, send any past mammogram images to your new doctor's office.  Wash your breasts and under your arms the day of the test.  Do not use deodorants, perfumes, lotions, or powders on the day of the test.  Take off any jewelry from your neck.  Wear clothes that you can change into and out of easily. What happens during the procedure?  You will undress from the waist up. You will put on a gown.  You will stand in front of the X-ray machine.  Each breast will be placed between two plastic or glass plates. The plates will press down on your breast for a few seconds. Try to stay as relaxed as possible. This does not cause any harm to your breasts. Any discomfort you feel will be very brief.  X-rays will be taken from different angles of each breast. The procedure may vary among doctors and hospitals. What happens after the procedure?  The mammogram will be looked at by a specialist (radiologist).  You may need to do certain parts of the test again. This depends on the quality of the images.  Ask when your test results will be ready. Make sure you get your test results.  You may go back to your normal activities. This information is not intended to replace advice given to you by your health care provider. Make sure you discuss any questions you have with your health care provider. Document Released: 04/04/2008 Document Revised: 06/14/2015 Document Reviewed: 03/17/2014 Elsevier Interactive Patient Education  2017 [employer].

## 2016-06-13 NOTE — Progress Notes (Signed)
46 y.o. G14P1001 Married  Caucasian Fe here for annual exam. Periods are changing with some 21 days to 35 days. Amount of bleeding essentially the same. Minimal cramping.  Weight loss in progress now have lost total of 30 pounds. Planning to continue working on until she reaches goal weight.. Sees Urgent care if needed. Has not scheduled mammogram but aware of need. Screening labs if needed. No other health issues today.  Patient's last menstrual period was 05/29/2016 (exact date).          Sexually active: Yes.    The current method of family planning is none. same sex partner.    Exercising: Yes.    cardio & strength training Smoker:  no  Health Maintenance: Pap:  05-03-13 neg HPV HR neg, 06-01-15 [facility] HPV HR- History of Abnormal Pap: no MMG:  06-09-14 bilateral & left breast u/sbirads 1:neg Self Breast exams: no Colonoscopy:  none BMD:   none TDaP: ? pt declines update today Shingles: no Pneumonia: no Hep C and HIV: HIV neg yrs ago Labs: none   reports that she quit smoking about 5 years ago. She has never used smokeless tobacco. She reports that she drinks about 1.8 - 2.4 oz of alcohol per week . She reports that she does not use drugs.  Past Medical History:  Diagnosis Date  . Anxiety   . Hypoglycemia   . Migraines   . Thyroid disease    hypothyroid    History reviewed. No pertinent surgical history.  Current Outpatient Prescriptions  Medication Sig Dispense Refill  . levothyroxine (SYNTHROID) 125 MCG tablet TAKE 1 TABLET (125 MCG TOTAL) BY MOUTH DAILY BEFORE BREAKFAST. 30 tablet 12   No current facility-administered medications for this visit.     Family History  Problem Relation Age of Onset  . Thyroid disease Father   . Leukemia Father   . Heart attack Maternal Aunt   . Heart disease Maternal Grandmother     ROS:  Pertinent items are noted in HPI.  Otherwise, a comprehensive ROS was negative.  Exam:   BP 98/62   Pulse 68   Resp 16   Ht 5' 1.5" (1.562 m)    Wt 173 lb (78.5 kg)   LMP 05/29/2016 (Exact Date)   BMI 32.16 kg/m  Height: 5' 1.5" (156.2 cm) Ht Readings from Last 3 Encounters:  06/13/16 5' 1.5" (1.562 m)  06/01/15 5' 1.5" (1.562 m)  12/01/14 5' 2.25" (1.581 m)    General appearance: alert, cooperative and appears stated age Head: Normocephalic, without obvious abnormality, atraumatic Neck: no adenopathy, supple, symmetrical, trachea midline and thyroid normal to inspection and palpation Lungs: clear to auscultation bilaterally Breasts: normal appearance, no masses or tenderness, No nipple retraction or dimpling, No nipple discharge or bleeding, No axillary or supraclavicular adenopathy Heart: regular rate and rhythm Abdomen: soft, non-tender; no masses,  no organomegaly Extremities: extremities normal, atraumatic, no cyanosis or edema Skin: Skin color, texture, turgor normal. No rashes or lesions Lymph nodes: Cervical, supraclavicular, and axillary nodes normal. No abnormal inguinal nodes palpated Neurologic: Grossly normal   Pelvic: External genitalia:  no lesions              Urethra:  normal appearing urethra with no masses, tenderness or lesions              Bartholin's and Skene's: normal                 Vagina: normal appearing vagina with normal color and  discharge, no lesions              Cervix: no cervical motion tenderness, no lesions and normal appearance              Pap taken: Yes.   Bimanual Exam:  Uterus:  normal size, contour, position, consistency, mobility, non-tender              Adnexa: normal adnexa and no mass, fullness, tenderness               Rectovaginal: Confirms               Anus:  normal sphincter tone, no lesions  Chaperone present: yes  A:  Well Woman with normal exam  Contraception none needed female partner  Hypothyroid stable medication TSH recheck today  Cycle change ? Related to weight loss or thyroid change.  Follow up pap today from [facility] - [facility]  Screening labs  P:   Reviewed  health and wellness pertinent to exam  Discussed relationship with thyroid/weight  and cycle change. Will check labs to see if all normal. Also if no amenorrhea and no increase in bleeding no concerns. Given menses calendar to keep and advise if warning signs occur.  Continue Synthroid current dosage until lab back. Has enough Rx until then.   Labs: TSH, Vitamin D, CBC  Pap smear: yes If pap normal return to routine screening, if not per results.  counseled on breast self exam, mammography screening, adequate intake of calcium and vitamin D, diet and exercise  return annually or prn  An After Visit Summary was printed and given to the patient.

## 2016-06-13 NOTE — Progress Notes (Signed)
Patient scheduled while in office. Spoke with Anderson Malta at [facility]. Patient scheduled for screening MMG on 06/23/16 at 8:30am. Patient is agreeable to date and time.

## 2016-06-14 LAB — VITAMIN D 25 HYDROXY (VIT D DEFICIENCY, FRACTURES): Vit D, 25-Hydroxy: 37 ng/mL (ref 30–100)

## 2016-06-17 ENCOUNTER — Other Ambulatory Visit: Payer: Self-pay | Admitting: Certified Nurse Midwife

## 2016-06-17 DIAGNOSIS — E039 Hypothyroidism, unspecified: Secondary | ICD-10-CM

## 2016-06-17 MED ORDER — LEVOTHYROXINE SODIUM 125 MCG PO TABS: ORAL_TABLET | ORAL | 12 refills | Status: DC

## 2016-06-18 LAB — CYTOLOGY - PAP
DIAGNOSIS: NEGATIVE
HPV (WINDOPATH): NOT DETECTED

## 2016-06-23 ENCOUNTER — Ambulatory Visit
Admission: RE | Admit: 2016-06-23 | Discharge: 2016-06-23 | Disposition: A | Discharge status: Home / Self Care | Payer: 59 | Source: Ambulatory Visit | Attending: Certified Nurse Midwife | Admitting: Certified Nurse Midwife

## 2016-06-23 DIAGNOSIS — Z1239 Encounter for other screening for malignant neoplasm of breast: Secondary | ICD-10-CM

## 2016-07-23 ENCOUNTER — Other Ambulatory Visit: Payer: Self-pay | Admitting: Certified Nurse Midwife

## 2016-07-23 DIAGNOSIS — E039 Hypothyroidism, unspecified: Secondary | ICD-10-CM

## 2016-07-24 NOTE — Telephone Encounter (Signed)
Medication refill request: synthroid  Last AEX:  06/13/16 DL Next AEX: 06/19/17 DL  Last MMG (if hormonal medication request): 06/23/16 BIRADS1:neg  Refill authorized: 06/17/16 #30tabs with 12 refills to [facility]

## 2017-06-19 ENCOUNTER — Encounter: Payer: Self-pay | Admitting: Certified Nurse Midwife

## 2017-06-19 ENCOUNTER — Ambulatory Visit (INDEPENDENT_AMBULATORY_CARE_PROVIDER_SITE_OTHER): Payer: 59 | Admitting: Certified Nurse Midwife

## 2017-06-19 ENCOUNTER — Other Ambulatory Visit: Payer: Self-pay

## 2017-06-19 VITALS — BP 112/80 | HR 64 | Resp 14 | Ht 61.75 in | Wt 166.0 lb

## 2017-06-19 DIAGNOSIS — Z01419 Encounter for gynecological examination (general) (routine) without abnormal findings: Secondary | ICD-10-CM | POA: Diagnosis not present

## 2017-06-19 DIAGNOSIS — Z0000 Encounter for general adult medical examination without abnormal findings: Secondary | ICD-10-CM

## 2017-06-19 DIAGNOSIS — E663 Overweight: Secondary | ICD-10-CM | POA: Diagnosis not present

## 2017-06-19 DIAGNOSIS — E038 Other specified hypothyroidism: Secondary | ICD-10-CM

## 2017-06-19 NOTE — Patient Instructions (Signed)

## 2017-06-19 NOTE — Progress Notes (Signed)
47 y.o. G63P1001 Married  Caucasian Fe here for annual exam. Periods regular but changing in length and amount. Has been keeping menses calendar and brought with her. No actual missed period. Working on [employer], with out much change. Does not eat breakfast,drinks protein drink. Exercises some. Sees Urgent if needed. Labs if needed. Thyroid medication working well, no skin or hair changes. Filled last Rx this week. No other health issues today.   No LMP recorded.          Sexually active: Yes.    The current method of family planning is none.    Exercising: Yes.    cardio/ strength training  Smoker:  Former smoker  Health Maintenance: Pap:  06-01-15 [facility] HPV HR neg, 06-13-16 neg HPV HR neg History of Abnormal Pap: no MMG:  06-23-16 category c density birads 1:neg Self Breast exams: no Colonoscopy:  none BMD:   none TDaP:  Pt declines Shingles: no Pneumonia: no Hep C and HIV: HIV neg yrs ago Labs: patient requesting labs to be drawn here   reports that she quit smoking about 6 years ago. She has never used smokeless tobacco. She reports that she drinks about 1.8 - 2.4 oz of alcohol per week. She reports that she does not use drugs.  Past Medical History:  Diagnosis Date  . Anxiety   . Hypoglycemia   . Migraines   . Thyroid disease    hypothyroid    No past surgical history on file.  Current Outpatient Medications  Medication Sig Dispense Refill  . levothyroxine (SYNTHROID) 125 MCG tablet TAKE 1 TABLET (125 MCG TOTAL) BY MOUTH DAILY BEFORE BREAKFAST. 30 tablet 12   No current facility-administered medications for this visit.     Family History  Problem Relation Age of Onset  . Thyroid disease Father   . Leukemia Father   . Heart attack Maternal Aunt   . Heart disease Maternal Grandmother   . Breast cancer Neg Hx     ROS:  Pertinent items are noted in HPI.  Otherwise, a comprehensive ROS was negative.  Exam:   There were no vitals taken for this visit.   Ht  Readings from Last 3 Encounters:  06/13/16 5' 1.5" (1.562 m)  06/01/15 5' 1.5" (1.562 m)  12/01/14 5' 2.25" (1.581 m)    General appearance: alert, cooperative and appears stated age Head: Normocephalic, without obvious abnormality, atraumatic Neck: no adenopathy, supple, symmetrical, trachea midline and thyroid normal to inspection and palpation Lungs: clear to auscultation bilaterally Breasts: normal appearance, no masses or tenderness, No nipple retraction or dimpling, No nipple discharge or bleeding, No axillary or supraclavicular adenopathy Heart: regular rate and rhythm Abdomen: soft, non-tender; no masses,  no organomegaly Extremities: extremities normal, atraumatic, no cyanosis or edema Skin: Skin color, texture, turgor normal. No rashes or lesions Lymph nodes: Cervical, supraclavicular, and axillary nodes normal. No abnormal inguinal nodes palpated Neurologic: Grossly normal   Pelvic: External genitalia:  no lesions              Urethra:  normal appearing urethra with no masses, tenderness or lesions              Bartholin's and Skene's: normal                 Vagina: normal appearing vagina with normal color and discharge, no lesions              Cervix: no cervical motion tenderness, no lesions and normal appearance  Pap taken: No. Bimanual Exam:  Uterus:  normal size, contour, position, consistency, mobility, non-tender              Adnexa: normal adnexa and no mass, fullness, tenderness               Rectovaginal: Confirms               Anus:  normal sphincter tone, no lesions  Chaperone present: yes  A:  Well Woman with normal exam  Contraception none, same sex partner  Hypothyroid on stable medication  Overweight working on weight management  Screening labs  P:   Reviewed health and wellness pertinent to exam  Reviewed period calendar and normal pattern. Will continue to keep and advise if change  Discussed eating breakfast daily instead protein  drink and eating lighter meal at hs. Patient will try.  Lab: TSH CMP,Vitamin D, CBC  Pap smear: no  counseled on breast self exam, mammography screening, adequate intake of calcium and vitamin D, diet and exercise  return annually or prn  An After Visit Summary was printed and given to the patient.

## 2017-06-20 LAB — COMPREHENSIVE METABOLIC PANEL
A/G RATIO: 1.7 (ref 1.2–2.2)
ALT: 18 IU/L (ref 0–32)
AST: 15 IU/L (ref 0–40)
Albumin: 4.6 g/dL (ref 3.5–5.5)
Alkaline Phosphatase: 53 IU/L (ref 39–117)
BUN/Creatinine Ratio: 9 (ref 9–23)
BUN: 8 mg/dL (ref 6–24)
Bilirubin Total: 0.4 mg/dL (ref 0.0–1.2)
CO2: 24 mmol/L (ref 20–29)
Calcium: 9.3 mg/dL (ref 8.7–10.2)
Chloride: 102 mmol/L (ref 96–106)
Creatinine, Ser: 0.85 mg/dL (ref 0.57–1.00)
GFR calc Af Amer: 96 mL/min/{1.73_m2} (ref >59)
GFR, EST NON AFRICAN AMERICAN: 84 mL/min/{1.73_m2} (ref >59)
Globulin, Total: 2.7 g/dL (ref 1.5–4.5)
Glucose: 84 mg/dL (ref 65–99)
POTASSIUM: 3.8 mmol/L (ref 3.5–5.2)
Sodium: 141 mmol/L (ref 134–144)
Total Protein: 7.3 g/dL (ref 6.0–8.5)

## 2017-06-20 LAB — CBC
HEMATOCRIT: 41 % (ref 34.0–46.6)
HEMOGLOBIN: 13.7 g/dL (ref 11.1–15.9)
MCH: 30.4 pg (ref 26.6–33.0)
MCHC: 33.4 g/dL (ref 31.5–35.7)
MCV: 91 fL (ref 79–97)
Platelets: 337 10*3/uL (ref 150–450)
RBC: 4.51 x10E6/uL (ref 3.77–5.28)
RDW: 14.3 % (ref 12.3–15.4)
WBC: 8 10*3/uL (ref 3.4–10.8)

## 2017-06-20 LAB — VITAMIN D 25 HYDROXY (VIT D DEFICIENCY, FRACTURES): VIT D 25 HYDROXY: 42.8 ng/mL (ref 30.0–100.0)

## 2017-06-20 LAB — TSH: TSH: 2.49 u[IU]/mL (ref 0.450–4.500)

## 2017-06-21 ENCOUNTER — Other Ambulatory Visit: Payer: Self-pay | Admitting: Certified Nurse Midwife

## 2017-06-21 DIAGNOSIS — E039 Hypothyroidism, unspecified: Secondary | ICD-10-CM

## 2017-06-21 MED ORDER — LEVOTHYROXINE SODIUM 125 MCG PO TABS: ORAL_TABLET | ORAL | 12 refills | Status: DC

## 2017-11-23 ENCOUNTER — Telehealth: Payer: Self-pay | Admitting: Certified Nurse Midwife

## 2017-11-23 NOTE — Telephone Encounter (Signed)
Appointment Request From: Waynetta Pean    With Provider: Melvia Heaps, CNM [facility]]    Preferred Date Range: 12/07/2017 - 12/11/2017    Preferred Times: Monday Afternoon, Tuesday Afternoon, Wednesday Afternoon, Thursday Afternoon, Friday Afternoon    Reason for visit: Request an Appointment    Comments:  Haven't had a period since Aug 3rd

## 2017-11-23 NOTE — Telephone Encounter (Signed)
Call to patient. Per ROI, can leave message on voice mail. Attempt to contact patient to schedule appointment. Per message, requesting afternoon appointment. Left voice mail with appointment 11-24-17 at 345 pm with French Ana, CNM.  My Chart message sent.   Encounter closed.

## 2017-11-24 ENCOUNTER — Telehealth: Payer: Self-pay | Admitting: Certified Nurse Midwife

## 2017-11-24 ENCOUNTER — Ambulatory Visit: Payer: 59 | Admitting: Certified Nurse Midwife

## 2017-11-24 NOTE — Telephone Encounter (Signed)
Left message to call Lucila Klecka, RN at [facility] [phone].   

## 2017-11-24 NOTE — Telephone Encounter (Signed)
Patient requested an appointment by mychart and was scheduled for today. Patient can not make the appointment for today for ammenorrhea  and has rescheduled to 12/09/17. This date and time was requested by the patient. Is this appointment timeframe ok?

## 2017-11-24 NOTE — Telephone Encounter (Signed)
Spoke with patient. No menses since 08/22/17. Denies any vasomotor symptoms or any other GYN symptoms. Patient is SA, same sex relationship.   Patient will keep OV as scheduled for 12/09/17.    Routing to provider for final review. Patient is agreeable to disposition. Will close encounter.

## 2017-12-08 ENCOUNTER — Telehealth: Payer: Self-pay

## 2017-12-08 NOTE — Telephone Encounter (Signed)
Called patient to see about moving her to an earlier appt in the day for tomorrow or for Thursday at 3:45. Left message for callback.

## 2017-12-09 ENCOUNTER — Ambulatory Visit: Payer: 59 | Admitting: Certified Nurse Midwife

## 2017-12-09 NOTE — Telephone Encounter (Signed)
appt moved to 11/21 at 3:45pm. Patient agrees.

## 2017-12-10 ENCOUNTER — Encounter: Payer: Self-pay | Admitting: Certified Nurse Midwife

## 2017-12-10 ENCOUNTER — Ambulatory Visit (INDEPENDENT_AMBULATORY_CARE_PROVIDER_SITE_OTHER): Payer: 59 | Admitting: Certified Nurse Midwife

## 2017-12-10 ENCOUNTER — Other Ambulatory Visit: Payer: Self-pay

## 2017-12-10 VITALS — BP 100/64 | HR 68 | Resp 16 | Wt 169.0 lb

## 2017-12-10 DIAGNOSIS — N912 Amenorrhea, unspecified: Secondary | ICD-10-CM

## 2017-12-10 NOTE — Progress Notes (Signed)
  Subjective:     Patient ID: Courtney Williams, female   DOB: 18-Mar-1970, 48 y.o.   MRN: [id number]  19 white female here with complaint of no period since 08/22/17. LMP was 7 days in length and normal amount. Weight fluctuation since last visit. Sexually active female only. Denies hot flashes or night sweats. Patient also is hypothyroid and on stable medication and consistent use.    Review of Systems  Constitutional: Negative for activity change and appetite change.  HENT: Negative.   Eyes: Negative.   Respiratory: Negative.   Cardiovascular: Negative.   Gastrointestinal: Negative.   Endocrine: Negative.   Genitourinary: Positive for menstrual problem.       No period in approximately 3 months  Musculoskeletal: Negative.   Skin: Negative.   Allergic/Immunologic: Negative.   Neurological: Negative.   Hematological: Negative.   Psychiatric/Behavioral: Negative.        Objective:   Physical Exam  Constitutional: She is oriented to person, place, and time. She appears well-developed and well-nourished.  Neck: Normal range of motion. No tracheal deviation present. Thyromegaly present.  Cardiovascular: Normal rate.  Pulmonary/Chest: Effort normal.  Abdominal: Soft.  Genitourinary: Rectum normal, vagina normal and uterus normal. Pelvic exam was performed with patient supine. There is no rash, tenderness, lesion or injury on the right labia. There is no rash, tenderness, lesion or injury on the left labia. Uterus is not tender. Cervix exhibits no motion tenderness, no discharge and no friability. Right adnexum displays no mass, no tenderness and no fullness. Left adnexum displays no mass, no tenderness and no fullness. No vaginal discharge found.  Lymphadenopathy: No inguinal adenopathy noted on the right or left side.  Neurological: She is alert and oriented to person, place, and time.  Skin: Skin is warm and dry.  Psychiatric: She has a normal mood and affect. Her speech is normal and behavior  is normal. Judgment and thought content normal. Cognition and memory are normal.       Assessment:     Normal pelvic exam Amenorrhea    Plan:     Discussed normal pelvic exam finding. Discussed thyroid, Pituitary glad and perimenopausal can cause amenorrhea. Weight loss and gain can affect period.  Important to evaluate, so she is not at risk for heavy bleeding or no period. Discussed lab work for evaluation. Agreeable. Lab:TSH, FSH, Prolactin Discussed possible Provera challenge if indicated by results. Questions addressed.  Rv prn , as above

## 2017-12-11 ENCOUNTER — Other Ambulatory Visit: Payer: Self-pay

## 2017-12-11 LAB — PROLACTIN: Prolactin: 16 ng/mL (ref 4.8–23.3)

## 2017-12-11 LAB — TSH: TSH: 1.5 u[IU]/mL (ref 0.450–4.500)

## 2017-12-11 LAB — FOLLICLE STIMULATING HORMONE: FSH: 35.2 m[IU]/mL

## 2017-12-11 MED ORDER — MEDROXYPROGESTERONE ACETATE 10 MG PO TABS: 10.0000 mg | ORAL_TABLET | Freq: Every day | ORAL | 0 refills | Status: DC

## 2017-12-11 NOTE — Telephone Encounter (Signed)
Patient notified of results. rx sent to pharmacy. 

## 2017-12-11 NOTE — Telephone Encounter (Signed)
Notify patient her TSH is normal, so medication she is on is stable  FSH shows low menopausal range  Prolactin normal  She will need Rx Provera 10 mg daily for 10 days and advise if bleeding or no bleeding up to two weeks after last dose of medication.( we discussed at visit)   Left message for callback.

## 2017-12-11 NOTE — Telephone Encounter (Signed)
Patient returned call

## 2018-06-30 ENCOUNTER — Other Ambulatory Visit: Payer: Self-pay

## 2018-07-02 ENCOUNTER — Ambulatory Visit: Payer: 59 | Admitting: Certified Nurse Midwife

## 2018-07-02 ENCOUNTER — Encounter: Payer: Self-pay | Admitting: Certified Nurse Midwife

## 2018-07-02 ENCOUNTER — Other Ambulatory Visit: Payer: Self-pay

## 2018-07-02 VITALS — BP 104/64 | HR 64 | Temp 97.7°F | Resp 16 | Ht 61.5 in | Wt 157.0 lb

## 2018-07-02 DIAGNOSIS — Z0000 Encounter for general adult medical examination without abnormal findings: Secondary | ICD-10-CM | POA: Diagnosis not present

## 2018-07-02 DIAGNOSIS — Z01419 Encounter for gynecological examination (general) (routine) without abnormal findings: Secondary | ICD-10-CM | POA: Diagnosis not present

## 2018-07-02 DIAGNOSIS — E038 Other specified hypothyroidism: Secondary | ICD-10-CM | POA: Diagnosis not present

## 2018-07-02 NOTE — Progress Notes (Signed)
48 y.o. G1P1001 Legally Separated  Caucasian Fe here for annual exam. Periods are still sporadic.Periods every other month for the past 3 months. No period in June yet.. Had periods March,  May. Periods have been lighter and not heavy. Thyroid medication stable. Sees Urgent care if needed. No STD screening needed, no partner change. Working on weight loss and down 12 pounds. No other health issues today.  Patient's last menstrual period was 06/13/2018 (exact date).          Sexually active: Yes.    The current method of family planning is none female partner.    Exercising: No.  neg Smoker:  no  Review of Systems  Constitutional: Negative.   HENT: Negative.   Eyes: Negative.   Respiratory: Negative.   Cardiovascular: Negative.   Gastrointestinal: Negative.   Genitourinary: Negative.   Musculoskeletal: Negative.   Skin: Negative.   Neurological: Negative.   Endo/Heme/Allergies: Negative.   Psychiatric/Behavioral: Negative.     Health Maintenance: Pap:  06-13-16 neg HPV HR neg History of Abnormal Pap: no MMG:  06-23-16 category c density birads 1:neg Self Breast exams: no Colonoscopy:  None, plans next year BMD:   none TDaP:  Pt declines Shingles: no Pneumonia: no Hep C and HIV: HIV neg yrs ago Labs: no   reports that she quit smoking about 7 years ago. She has never used smokeless tobacco. She reports current alcohol use of about 3.0 - 4.0 standard drinks of alcohol per week. She reports that she does not use drugs.  Past Medical History:  Diagnosis Date  . Anxiety   . Hypoglycemia   . Migraines   . Thyroid disease    hypothyroid    History reviewed. No pertinent surgical history.  Current Outpatient Medications  Medication Sig Dispense Refill  . fexofenadine (ALLEGRA) 180 MG tablet Take 180 mg by mouth daily.    Marland Kitchen levothyroxine (SYNTHROID) 125 MCG tablet TAKE 1 TABLET (125 MCG TOTAL) BY MOUTH DAILY BEFORE BREAKFAST. 30 tablet 12   No current facility-administered  medications for this visit.     Family History  Problem Relation Age of Onset  . Thyroid disease Father   . Leukemia Father   . Heart attack Maternal Aunt   . Heart disease Maternal Grandmother   . Breast cancer Neg Hx     ROS:  Pertinent items are noted in HPI.  Otherwise, a comprehensive ROS was negative.  Exam:   BP 104/64   Pulse 64   Temp 97.7 F (36.5 C) (Skin)   Resp 16   Ht 5' 1.5" (1.562 m)   Wt 157 lb (71.2 kg)   LMP 06/13/2018 (Exact Date)   BMI 29.18 kg/m  Height: 5' 1.5" (156.2 cm) Ht Readings from Last 3 Encounters:  07/02/18 5' 1.5" (1.562 m)  06/19/17 5' 1.75" (1.568 m)  06/13/16 5' 1.5" (1.562 m)    General appearance: alert, cooperative and appears stated age Head: Normocephalic, without obvious abnormality, atraumatic Neck: no adenopathy, supple, symmetrical, trachea midline and thyroid normal to inspection and palpation Lungs: clear to auscultation bilaterally Breasts: normal appearance, no masses or tenderness, No nipple retraction or dimpling, No nipple discharge or bleeding, No axillary or supraclavicular adenopathy Heart: regular rate and rhythm Abdomen: soft, non-tender; no masses,  no organomegaly Extremities: extremities normal, atraumatic, no cyanosis or edema Skin: Skin color, texture, turgor normal. No rashes or lesions Lymph nodes: Cervical, supraclavicular, and axillary nodes normal. No abnormal inguinal nodes palpated Neurologic: Grossly normal  Pelvic: External genitalia:  no lesions              Urethra:  normal appearing urethra with no masses, tenderness or lesions              Bartholin's and Skene's: normal                 Vagina: normal appearing vagina with normal color and discharge, no lesions              Cervix: no cervical motion tenderness and no lesions              Pap taken: No. Bimanual Exam:  Uterus:  normal size, contour, position, consistency, mobility, non-tender and anteverted              Adnexa: normal  adnexa and no mass, fullness, tenderness               Rectovaginal: Confirms               Anus:  normal sphincter tone, no lesions  Chaperone present: yes  A:  Well Woman with normal exam  Contraception none needed, female partner  Menstrual changes, but still irregular at times  Hypothyroid stable on medication, lab due today   Mammogram due  P:   Reviewed health and wellness pertinent to exam  Discussed importance of keeping menses calendar and if no menses in 3 months would need to come in for labs to assess. Patient aware and this has not occurred. Feels related to thyroid.  Discussed thyroid can affect menses and so can weight changes. Will do lab today. Stressed medication importance and consistent use. Has enough medication until lab in.  Lab: TSH  Stressed importance of mammogram, patient to schedule  Pap smear: no   counseled on breast self exam, mammography screening, STD prevention, HIV risk factors and prevention, feminine hygiene, adequate intake of calcium and vitamin D, diet and exercise  return annually or prn  An After Visit Summary was printed and given to the patient.

## 2018-07-03 LAB — COMPREHENSIVE METABOLIC PANEL
ALT: 13 IU/L (ref 0–32)
AST: 10 IU/L (ref 0–40)
Albumin/Globulin Ratio: 1.9 (ref 1.2–2.2)
Albumin: 4.4 g/dL (ref 3.8–4.8)
Alkaline Phosphatase: 48 IU/L (ref 39–117)
BUN/Creatinine Ratio: 14 (ref 9–23)
BUN: 11 mg/dL (ref 6–24)
Bilirubin Total: 0.4 mg/dL (ref 0.0–1.2)
CO2: 26 mmol/L (ref 20–29)
Calcium: 9.5 mg/dL (ref 8.7–10.2)
Chloride: 103 mmol/L (ref 96–106)
Creatinine, Ser: 0.76 mg/dL (ref 0.57–1.00)
GFR calc Af Amer: 110 mL/min/{1.73_m2} (ref >59)
GFR calc non Af Amer: 95 mL/min/{1.73_m2} (ref >59)
Globulin, Total: 2.3 g/dL (ref 1.5–4.5)
Glucose: 102 mg/dL — ABNORMAL HIGH (ref 65–99)
Potassium: 3.7 mmol/L (ref 3.5–5.2)
Sodium: 141 mmol/L (ref 134–144)
Total Protein: 6.7 g/dL (ref 6.0–8.5)

## 2018-07-03 LAB — VITAMIN D 25 HYDROXY (VIT D DEFICIENCY, FRACTURES): Vit D, 25-Hydroxy: 35.2 ng/mL (ref 30.0–100.0)

## 2018-07-03 LAB — LIPID PANEL
Chol/HDL Ratio: 2 ratio (ref 0.0–4.4)
Cholesterol, Total: 172 mg/dL (ref 100–199)
HDL: 86 mg/dL (ref >39)
LDL Calculated: 67 mg/dL (ref 0–99)
Triglycerides: 94 mg/dL (ref 0–149)
VLDL Cholesterol Cal: 19 mg/dL (ref 5–40)

## 2018-07-03 LAB — TSH: TSH: 3.77 u[IU]/mL (ref 0.450–4.500)

## 2018-07-04 ENCOUNTER — Other Ambulatory Visit: Payer: Self-pay | Admitting: Certified Nurse Midwife

## 2018-07-04 DIAGNOSIS — E039 Hypothyroidism, unspecified: Secondary | ICD-10-CM

## 2018-07-04 MED ORDER — LEVOTHYROXINE SODIUM 125 MCG PO TABS: ORAL_TABLET | ORAL | 12 refills | Status: DC

## 2018-08-27 ENCOUNTER — Other Ambulatory Visit: Payer: Self-pay | Admitting: Certified Nurse Midwife

## 2018-08-27 DIAGNOSIS — Z1231 Encounter for screening mammogram for malignant neoplasm of breast: Secondary | ICD-10-CM

## 2018-10-11 ENCOUNTER — Other Ambulatory Visit: Payer: Self-pay

## 2018-10-11 ENCOUNTER — Ambulatory Visit
Admission: RE | Admit: 2018-10-11 | Discharge: 2018-10-11 | Disposition: A | Discharge status: Home / Self Care | Payer: 59 | Source: Ambulatory Visit | Attending: Certified Nurse Midwife | Admitting: Certified Nurse Midwife

## 2018-10-11 DIAGNOSIS — Z1231 Encounter for screening mammogram for malignant neoplasm of breast: Secondary | ICD-10-CM

## 2018-10-13 ENCOUNTER — Other Ambulatory Visit: Payer: Self-pay | Admitting: Certified Nurse Midwife

## 2018-10-13 DIAGNOSIS — R928 Other abnormal and inconclusive findings on diagnostic imaging of breast: Secondary | ICD-10-CM

## 2018-10-15 ENCOUNTER — Ambulatory Visit
Admission: RE | Admit: 2018-10-15 | Discharge: 2018-10-15 | Disposition: A | Discharge status: Home / Self Care | Payer: 59 | Source: Ambulatory Visit | Attending: Certified Nurse Midwife | Admitting: Certified Nurse Midwife

## 2018-10-15 ENCOUNTER — Other Ambulatory Visit: Payer: Self-pay | Admitting: Certified Nurse Midwife

## 2018-10-15 ENCOUNTER — Other Ambulatory Visit: Payer: Self-pay

## 2018-10-15 DIAGNOSIS — N6320 Unspecified lump in the left breast, unspecified quadrant: Secondary | ICD-10-CM

## 2018-10-15 DIAGNOSIS — R928 Other abnormal and inconclusive findings on diagnostic imaging of breast: Secondary | ICD-10-CM

## 2018-10-20 ENCOUNTER — Other Ambulatory Visit: Payer: Self-pay

## 2018-10-20 ENCOUNTER — Other Ambulatory Visit (HOSPITAL_COMMUNITY): Payer: Self-pay | Admitting: Diagnostic Radiology

## 2018-10-20 ENCOUNTER — Ambulatory Visit
Admission: RE | Admit: 2018-10-20 | Discharge: 2018-10-20 | Disposition: A | Discharge status: Home / Self Care | Payer: 59 | Source: Ambulatory Visit | Attending: Certified Nurse Midwife | Admitting: Certified Nurse Midwife

## 2018-10-20 DIAGNOSIS — N6320 Unspecified lump in the left breast, unspecified quadrant: Secondary | ICD-10-CM

## 2018-10-21 ENCOUNTER — Encounter: Payer: Self-pay | Admitting: *Deleted

## 2018-10-22 ENCOUNTER — Other Ambulatory Visit: Payer: Self-pay | Admitting: *Deleted

## 2018-10-22 DIAGNOSIS — C50412 Malignant neoplasm of upper-outer quadrant of left female breast: Secondary | ICD-10-CM

## 2018-10-22 DIAGNOSIS — Z170 Estrogen receptor positive status [ER+]: Secondary | ICD-10-CM | POA: Insufficient documentation

## 2018-10-25 ENCOUNTER — Other Ambulatory Visit: Payer: Self-pay | Admitting: *Deleted

## 2018-10-26 NOTE — Progress Notes (Signed)
[facility] CONSULT NOTE  Patient Care Team: Patient, [name] as PCP - General (General Practice) Rockwell Germany, RN as Oncology Nurse Navigator Mauro Kaufmann, RN as Oncology Nurse Navigator Rolm Bookbinder, MD as Consulting Physician (General Surgery) Nicholas Lose, MD as Consulting Physician (Hematology and Oncology)  CHIEF COMPLAINTS/PURPOSE OF CONSULTATION:  Newly diagnosed breast cancer  HISTORY OF PRESENTING ILLNESS:  Courtney Williams 48 y.o. female is here because of recent diagnosis of invasive ductal carcinoma of the left breast. The cancer was detected on a routine screening mammogram on 10/11/18. Diagnostic mammogram of the left breast on 10/15/18 showed a 1.4cm mass in the left breast at the 2:00 position with no left axillary adenopathy. Biopsy on 10/20/18 showed invasive ductal carcinoma, grade 2, HER-2 equivocal (2+), ER+ 80%, PR+ 10%, Ki67 50%. She presents to the clinic today for initial evaluation and discussion of treatment options.   I reviewed her records extensively and collaborated the history with the patient.  SUMMARY OF ONCOLOGIC HISTORY: Oncology History  Malignant neoplasm of upper-outer quadrant of left breast in female, estrogen receptor positive ([place])  10/22/2018 Initial Diagnosis   Routine screening mammogram showed a 1.4cm mass in the left breast at the 2:00 position with no left axillary adenopathy. Biopsy showed IDC, grade 2, HER-2 equivocal (2+), ER+ 80%, PR+ 10%, Ki67 50%.     MEDICAL HISTORY:  Past Medical History:  Diagnosis Date  . Anxiety   . Hypoglycemia   . Migraines   . Thyroid disease    hypothyroid    SURGICAL HISTORY: No past surgical history on file.  SOCIAL HISTORY: Social History   Socioeconomic History  . Marital status: Married    Spouse name: Not on file  . Number of children: Not on file  . Years of education: Not on file  . Highest education level: Not on file  Occupational History  . Not on file   Social Needs  . Financial resource strain: Not on file  . Food insecurity    Worry: Not on file    Inability: Not on file  . Transportation needs    Medical: Not on file    Non-medical: Not on file  Tobacco Use  . Smoking status: Former Smoker    Quit date: 01/21/2011    Years since quitting: 7.7  . Smokeless tobacco: Never Used  . Tobacco comment: rare  Substance and Sexual Activity  . Alcohol use: Yes    Alcohol/week: 3.0 - 4.0 standard drinks    Types: 3 - 4 Standard drinks or equivalent per week  . Drug use: No  . Sexual activity: Yes    Partners: Female    Comment: same sex partner  Lifestyle  . Physical activity    Days per week: Not on file    Minutes per session: Not on file  . Stress: Not on file  Relationships  . Social Herbalist on phone: Not on file    Gets together: Not on file    Attends religious service: Not on file    Active member of club or organization: Not on file    Attends meetings of clubs or organizations: Not on file    Relationship status: Not on file  . Intimate partner violence    Fear of current or ex partner: Not on file    Emotionally abused: Not on file    Physically abused: Not on file    Forced sexual activity: Not on  file  Other Topics Concern  . Not on file  Social History Narrative  . Not on file    FAMILY HISTORY: Family History  Problem Relation Age of Onset  . Thyroid disease Father   . Leukemia Father   . Heart attack Maternal Aunt   . Heart disease Maternal Grandmother   . Breast cancer Neg Hx     ALLERGIES:  has No Known Allergies.  MEDICATIONS:  Current Outpatient Medications  Medication Sig Dispense Refill  . fexofenadine (ALLEGRA) 180 MG tablet Take 180 mg by mouth daily.    Marland Kitchen levothyroxine (SYNTHROID) 125 MCG tablet TAKE 1 TABLET (125 MCG TOTAL) BY MOUTH DAILY BEFORE BREAKFAST. 30 tablet 12   No current facility-administered medications for this visit.     REVIEW OF SYSTEMS:   Constitutional:  Denies fevers, chills or abnormal night sweats Eyes: Denies blurriness of vision, double vision or watery eyes Ears, nose, mouth, throat, and face: Denies mucositis or sore throat Respiratory: Denies cough, dyspnea or wheezes Cardiovascular: Denies palpitation, chest discomfort or lower extremity swelling Gastrointestinal:  Denies nausea, heartburn or change in bowel habits Skin: Denies abnormal skin rashes Lymphatics: Denies new lymphadenopathy or easy bruising Neurological:Denies numbness, tingling or new weaknesses Behavioral/Psych: Mood is stable, no new changes  Breast: Denies any palpable lumps or discharge All other systems were reviewed with the patient and are negative.  PHYSICAL EXAMINATION: ECOG PERFORMANCE STATUS: 0 - Asymptomatic  Vitals:   10/27/18 1238  BP: 126/75  Pulse: 71  Resp: 20  Temp: 98.3 F (36.8 C)  SpO2: 99%   Filed Weights   10/27/18 1238  Weight: 158 lb 8 oz (71.9 kg)    GENERAL:alert, no distress and comfortable SKIN: skin color, texture, turgor are normal, no rashes or significant lesions EYES: normal, conjunctiva are pink and non-injected, sclera clear OROPHARYNX:no exudate, no erythema and lips, buccal mucosa, and tongue normal  NECK: supple, thyroid normal size, non-tender, without nodularity LYMPH:  no palpable lymphadenopathy in the cervical, axillary or inguinal LUNGS: clear to auscultation and percussion with normal breathing effort HEART: regular rate & rhythm and no murmurs and no lower extremity edema ABDOMEN:abdomen soft, non-tender and normal bowel sounds Musculoskeletal:no cyanosis of digits and no clubbing  PSYCH: alert & oriented x 3 with fluent speech NEURO: no focal motor/sensory deficits BREAST: No palpable nodules in breast. No palpable axillary or supraclavicular lymphadenopathy (exam performed in the presence of a chaperone)   LABORATORY DATA:  I have reviewed the data as listed Lab Results  Component Value Date   WBC  8.2 10/27/2018   HGB 14.0 10/27/2018   HCT 42.3 10/27/2018   MCV 92.6 10/27/2018   PLT 321 10/27/2018   Lab Results  Component Value Date   NA 140 10/27/2018   K 3.8 10/27/2018   CL 104 10/27/2018   CO2 28 10/27/2018    RADIOGRAPHIC STUDIES: I have personally reviewed the radiological reports and agreed with the findings in the report.  ASSESSMENT AND PLAN:  Malignant neoplasm of upper-outer quadrant of left breast in female, estrogen receptor positive ([place]) 10/22/18: Routine screening mammogram showed a 1.4cm mass in the left breast at the 2:00 position with no left axillary adenopathy. Biopsy showed IDC, grade 2, HER-2 equivocal (2+), ER+ 80%, PR+ 10%, Ki67 50%.  Pathology and radiology counseling:Discussed with the patient, the details of pathology including the type of breast cancer,the clinical staging, the significance of ER, PR and HER-2/neu receptors and the implications for treatment. After reviewing the  pathology in detail, we proceeded to discuss the different treatment options between surgery, radiation, chemotherapy, antiestrogen therapies.  Recommendations: 1. Breast conserving surgery followed by 2. Oncotype DX testing to determine if chemotherapy would be of any benefit followed by 3. Adjuvant radiation therapy followed by 4. Adjuvant antiestrogen therapy  Oncotype counseling: I discussed Oncotype DX test. I explained to the patient that this is a 21 gene panel to evaluate patient tumors DNA to calculate recurrence score. This would help determine whether patient has high risk or intermediate risk or low risk breast cancer. She understands that if her tumor was found to be high risk, she would benefit from systemic chemotherapy. If low risk, no need of chemotherapy. If she was found to be intermediate risk, we would need to evaluate the score as well as other risk factors and determine if an abbreviated chemotherapy may be of benefit.  Return to clinic after surgery to  discuss final pathology report and then determine if Oncotype DX testing will need to be sent.     All questions were answered. The patient knows to call the clinic with any problems, questions or concerns.   Rulon Eisenmenger, MD 10/27/2018    I, Molly Dorshimer, am acting as scribe for Nicholas Lose, MD.  I have reviewed the above documentation for accuracy and completeness, and I agree with the above.

## 2018-10-27 ENCOUNTER — Ambulatory Visit
Admission: RE | Admit: 2018-10-27 | Discharge: 2018-10-27 | Disposition: A | Discharge status: Home / Self Care | Payer: 59 | Source: Ambulatory Visit | Attending: Radiation Oncology | Admitting: Radiation Oncology

## 2018-10-27 ENCOUNTER — Other Ambulatory Visit: Payer: Self-pay

## 2018-10-27 ENCOUNTER — Inpatient Hospital Stay: Payer: 59 | Attending: Hematology and Oncology | Admitting: Hematology and Oncology

## 2018-10-27 ENCOUNTER — Encounter: Payer: Self-pay | Admitting: Genetic Counselor

## 2018-10-27 ENCOUNTER — Encounter: Payer: Self-pay | Admitting: Physical Therapy

## 2018-10-27 ENCOUNTER — Inpatient Hospital Stay: Payer: 59

## 2018-10-27 ENCOUNTER — Ambulatory Visit (HOSPITAL_BASED_OUTPATIENT_CLINIC_OR_DEPARTMENT_OTHER): Payer: 59 | Admitting: Genetic Counselor

## 2018-10-27 ENCOUNTER — Encounter: Payer: Self-pay | Admitting: Hematology and Oncology

## 2018-10-27 ENCOUNTER — Ambulatory Visit: Payer: 59 | Attending: General Surgery | Admitting: Physical Therapy

## 2018-10-27 ENCOUNTER — Other Ambulatory Visit: Payer: Self-pay | Admitting: General Surgery

## 2018-10-27 ENCOUNTER — Encounter: Payer: Self-pay | Admitting: General Practice

## 2018-10-27 DIAGNOSIS — Z87891 Personal history of nicotine dependence: Secondary | ICD-10-CM

## 2018-10-27 DIAGNOSIS — C50412 Malignant neoplasm of upper-outer quadrant of left female breast: Secondary | ICD-10-CM | POA: Diagnosis not present

## 2018-10-27 DIAGNOSIS — R293 Abnormal posture: Secondary | ICD-10-CM | POA: Diagnosis present

## 2018-10-27 DIAGNOSIS — Z8349 Family history of other endocrine, nutritional and metabolic diseases: Secondary | ICD-10-CM

## 2018-10-27 DIAGNOSIS — Z170 Estrogen receptor positive status [ER+]: Secondary | ICD-10-CM | POA: Insufficient documentation

## 2018-10-27 DIAGNOSIS — Z7289 Other problems related to lifestyle: Secondary | ICD-10-CM

## 2018-10-27 DIAGNOSIS — Z806 Family history of leukemia: Secondary | ICD-10-CM

## 2018-10-27 DIAGNOSIS — Z8249 Family history of ischemic heart disease and other diseases of the circulatory system: Secondary | ICD-10-CM

## 2018-10-27 LAB — CMP (CANCER CENTER ONLY)
ALT: 14 U/L (ref 0–44)
AST: 13 U/L — ABNORMAL LOW (ref 15–41)
Albumin: 4 g/dL (ref 3.5–5.0)
Alkaline Phosphatase: 46 U/L (ref 38–126)
Anion gap: 8 (ref 5–15)
BUN: 12 mg/dL (ref 6–20)
CO2: 28 mmol/L (ref 22–32)
Calcium: 9.3 mg/dL (ref 8.9–10.3)
Chloride: 104 mmol/L (ref 98–111)
Creatinine: 0.77 mg/dL (ref 0.44–1.00)
GFR, Est AFR Am: >60 mL/min (ref >60)
GFR, Estimated: >60 mL/min (ref >60)
Glucose, Bld: 89 mg/dL (ref 70–99)
Potassium: 3.8 mmol/L (ref 3.5–5.1)
Sodium: 140 mmol/L (ref 135–145)
Total Bilirubin: 0.3 mg/dL (ref 0.3–1.2)
Total Protein: 6.9 g/dL (ref 6.5–8.1)

## 2018-10-27 LAB — CBC WITH DIFFERENTIAL (CANCER CENTER ONLY)
Abs Immature Granulocytes: 0.01 10*3/uL (ref 0.00–0.07)
Basophils Absolute: 0.1 10*3/uL (ref 0.0–0.1)
Basophils Relative: 1 %
Eosinophils Absolute: 0.2 10*3/uL (ref 0.0–0.5)
Eosinophils Relative: 3 %
HCT: 42.3 % (ref 36.0–46.0)
Hemoglobin: 14 g/dL (ref 12.0–15.0)
Immature Granulocytes: 0 %
Lymphocytes Relative: 35 %
Lymphs Abs: 2.9 10*3/uL (ref 0.7–4.0)
MCH: 30.6 pg (ref 26.0–34.0)
MCHC: 33.1 g/dL (ref 30.0–36.0)
MCV: 92.6 fL (ref 80.0–100.0)
Monocytes Absolute: 0.6 10*3/uL (ref 0.1–1.0)
Monocytes Relative: 7 %
Neutro Abs: 4.5 10*3/uL (ref 1.7–7.7)
Neutrophils Relative %: 54 %
Platelet Count: 321 10*3/uL (ref 150–400)
RBC: 4.57 MIL/uL (ref 3.87–5.11)
RDW: 13.2 % (ref 11.5–15.5)
WBC Count: 8.2 10*3/uL (ref 4.0–10.5)
nRBC: 0 % (ref 0.0–0.2)

## 2018-10-27 NOTE — Progress Notes (Signed)
Radiation Oncology         ([phone]) [phone] ________________________________  Name: Courtney Williams        MRN: [id number]  Date of Service: 10/27/2018 DOB: 08-15-1970  YQ:[id number], [name]  Rolm Bookbinder, MD     REFERRING PHYSICIAN: Rolm Bookbinder, MD   DIAGNOSIS: The encounter diagnosis was Malignant neoplasm of upper-outer quadrant of left breast in female, estrogen receptor positive ([place]).   HISTORY OF PRESENT ILLNESS: Courtney Williams is a 48 y.o. female seen in the multidisciplinary breast clinic for a new diagnosis of left breast cancer. The patient was noted to have a screening detected mass in the left breast.  On diagnostic imaging a mass at 2:00 in the upper outer quadrant measured 1.4 x 1.3 x 1 cm.  Her axilla was negative for adenopathy, and a biopsy on 10/20/2018 revealed a grade 2 invasive ductal carcinoma, her tumor was ER/PR positive, HER-2 was negative and her Ki-67 was 15%.  She is seen today to discuss treatment recommendations for her cancer.    PREVIOUS RADIATION THERAPY: No   PAST MEDICAL HISTORY:  Past Medical History:  Diagnosis Date   Anxiety    Hypoglycemia    Migraines    Thyroid disease    hypothyroid       PAST SURGICAL HISTORY:No past surgical history on file.   FAMILY HISTORY:  Family History  Problem Relation Age of Onset   Thyroid disease Father    Leukemia Father    Heart attack Maternal Aunt    Heart disease Maternal Grandmother    Breast cancer Neg Hx      SOCIAL HISTORY:  reports that she quit smoking about 7 years ago. She has never used smokeless tobacco. She reports current alcohol use of about 3.0 - 4.0 standard drinks of alcohol per week. She reports that she does not use drugs. The patient is married and lives in [place]. Her wife Karna Christmas is able to join on the call via facetime. She works at an [employer] firm.  ALLERGIES: Patient has no known allergies.   MEDICATIONS:  Current Outpatient Medications  Medication Sig  Dispense Refill   fexofenadine (ALLEGRA) 180 MG tablet Take 180 mg by mouth daily.     levothyroxine (SYNTHROID) 125 MCG tablet TAKE 1 TABLET (125 MCG TOTAL) BY MOUTH DAILY BEFORE BREAKFAST. 30 tablet 12   No current facility-administered medications for this encounter.      REVIEW OF SYSTEMS: On review of systems, the patient reports that she is doing well overall. She denies any chest pain, shortness of breath, cough, fevers, chills, night sweats, unintended weight changes. She denies any bowel or bladder disturbances, and denies abdominal pain, nausea or vomiting. She denies any new musculoskeletal or joint aches or pains. A complete review of systems is obtained and is otherwise negative.     PHYSICAL EXAM:  Wt Readings from Last 3 Encounters:  10/27/18 158 lb 8 oz (71.9 kg)  07/02/18 157 lb (71.2 kg)  12/10/17 169 lb (76.7 kg)   Temp Readings from Last 3 Encounters:  10/27/18 98.3 F (36.8 C) (Temporal)  07/02/18 97.7 F (36.5 C) (Skin)   BP Readings from Last 3 Encounters:  10/27/18 126/75  07/02/18 104/64  12/10/17 100/64   Pulse Readings from Last 3 Encounters:  10/27/18 71  07/02/18 64  12/10/17 68   In general this is a well appearing Caucasian female in no acute distress.  She's alert and oriented x4 and appropriate throughout the examination.  Cardiopulmonary assessment is negative for acute distress and she exhibits normal effort.  Bilateral breast exam is deferred.   ECOG = 0  0 - Asymptomatic (Fully active, able to carry on all predisease activities without restriction)  1 - Symptomatic but completely ambulatory (Restricted in physically strenuous activity but ambulatory and able to carry out work of a light or sedentary nature. For example, light housework, office work)  2 - Symptomatic, <50% in bed during the day (Ambulatory and capable of all self care but unable to carry out any work activities. Up and about more than 50% of waking hours)  3 -  Symptomatic, >50% in bed, but not bedbound (Capable of only limited self-care, confined to bed or chair 50% or more of waking hours)  4 - Bedbound (Completely disabled. Cannot carry on any self-care. Totally confined to bed or chair)  5 - Death   Eustace Pen MM, Creech RH, Tormey DC, et al. [phone]). "Toxicity and response criteria of the [facility]". [place] Oncol. 5 (6): 649-55    LABORATORY DATA:  Lab Results  Component Value Date   WBC 8.2 10/27/2018   HGB 14.0 10/27/2018   HCT 42.3 10/27/2018   MCV 92.6 10/27/2018   PLT 321 10/27/2018   Lab Results  Component Value Date   NA 140 10/27/2018   K 3.8 10/27/2018   CL 104 10/27/2018   CO2 28 10/27/2018   Lab Results  Component Value Date   ALT 14 10/27/2018   AST 13 (L) 10/27/2018   ALKPHOS 46 10/27/2018   BILITOT 0.3 10/27/2018      RADIOGRAPHY: US Breast Ltd Uni Left Inc Axilla  Result Date: 10/15/2018 CLINICAL DATA:  Patient recalled from screening for left breast mass. EXAM: DIGITAL DIAGNOSTIC LEFT MAMMOGRAM WITH CAD AND TOMO ULTRASOUND LEFT BREAST COMPARISON:  Previous exam(s). ACR Breast Density Category c: The breast tissue is heterogeneously dense, which may obscure small masses. FINDINGS: Persistent spiculated mass within the upper-outer left breast middle depth, further evaluated with spot compression CC and MLO tomosynthesis images. Mammographic images were processed with CAD. Targeted ultrasound is performed, showing a 1.4 x 1.0 x 1 3 cm irregular hypoechoic mass left breast 2 o'clock position 3 cm from nipple. No left axillary adenopathy. IMPRESSION: Suspicious left breast mass 2 o'clock position. RECOMMENDATION: Ultrasound-guided core needle biopsy left breast mass. I have discussed the findings and recommendations with the patient. If applicable, a reminder letter will be sent to the patient regarding the next appointment. BI-RADS CATEGORY  5: Highly suggestive of malignancy. Electronically  Signed   By: Lovey Newcomer M.D.   On: 10/15/2018 13:24   Mm Diag Breast Tomo Uni Left  Result Date: 10/15/2018 CLINICAL DATA:  Patient recalled from screening for left breast mass. EXAM: DIGITAL DIAGNOSTIC LEFT MAMMOGRAM WITH CAD AND TOMO ULTRASOUND LEFT BREAST COMPARISON:  Previous exam(s). ACR Breast Density Category c: The breast tissue is heterogeneously dense, which may obscure small masses. FINDINGS: Persistent spiculated mass within the upper-outer left breast middle depth, further evaluated with spot compression CC and MLO tomosynthesis images. Mammographic images were processed with CAD. Targeted ultrasound is performed, showing a 1.4 x 1.0 x 1 3 cm irregular hypoechoic mass left breast 2 o'clock position 3 cm from nipple. No left axillary adenopathy. IMPRESSION: Suspicious left breast mass 2 o'clock position. RECOMMENDATION: Ultrasound-guided core needle biopsy left breast mass. I have discussed the findings and recommendations with the patient. If applicable, a reminder letter will be sent to  the patient regarding the next appointment. BI-RADS CATEGORY  5: Highly suggestive of malignancy. Electronically Signed   By: Lovey Newcomer M.D.   On: 10/15/2018 13:24   Mm 3d Screen Breast Bilateral  Result Date: 10/12/2018 CLINICAL DATA:  Screening. EXAM: DIGITAL SCREENING BILATERAL MAMMOGRAM WITH TOMO AND CAD COMPARISON:  Previous exam(s). ACR Breast Density Category b: There are scattered areas of fibroglandular density. FINDINGS: In the left breast, a possible mass warrants further evaluation. In the right breast, no findings suspicious for malignancy. Images were processed with CAD. IMPRESSION: Further evaluation is suggested for possible mass in the left breast. RECOMMENDATION: Diagnostic mammogram and possibly ultrasound of the left breast. (Code:[id number]) The patient will be contacted regarding the findings, and additional imaging will be scheduled. BI-RADS CATEGORY  0: Incomplete. Need additional  imaging evaluation and/or prior mammograms for comparison. Electronically Signed   By: Abelardo Diesel M.D.   On: 10/12/2018 10:54   Mm Clip Placement Left  Result Date: 10/20/2018 CLINICAL DATA:  Status post ultrasound-guided core needle biopsy of a left breast mass. Evaluate post procedure clip placement. EXAM: DIAGNOSTIC LEFT MAMMOGRAM POST ULTRASOUND BIOPSY COMPARISON:  Previous exam(s). FINDINGS: Mammographic images were obtained following ultrasound guided biopsy of a 2 o'clock position left breast mass. The ribbon shaped biopsy clip lies within the mass. IMPRESSION: Well-positioned ribbon shaped biopsy clip following ultrasound-guided core needle biopsy of the left breast. Final Assessment: Post Procedure Mammograms for Marker Placement Electronically Signed   By: Lajean Manes M.D.   On: 10/20/2018 13:30   [place] Lt Breast Bx W Loc Dev 1st Lesion Img Bx Spec US Guide  Addendum Date: 10/22/2018   ADDENDUM REPORT: 10/22/2018 09:59 ADDENDUM: Pathology revealed GRADE II INVASIVE DUCTAL CARCINOMA of the LEFT breast, 2 o'clock. This was found to be concordant by Dr. Lajean Manes. Pathology results were discussed with the patient by telephone. The patient reported doing well after the biopsy with tenderness at the site. Post biopsy instructions and care were reviewed and questions were answered. The patient was encouraged to call [facility] for any additional concerns. The patient was referred to [facility] on October 27, 2018. Pathology results reported by Stacie Acres, RN on 10/22/2018. Electronically Signed   By: Lajean Manes M.D.   On: 10/22/2018 09:59   Result Date: 10/22/2018 CLINICAL DATA:  Patient presents for ultrasound-guided core needle biopsy of a 2 o'clock position left breast mass. EXAM: ULTRASOUND GUIDED LEFT BREAST CORE NEEDLE BIOPSY COMPARISON:  Previous exam(s). FINDINGS: I met with the  patient and we discussed the procedure of ultrasound-guided biopsy, including benefits and alternatives. We discussed the high likelihood of a successful procedure. We discussed the risks of the procedure, including infection, bleeding, tissue injury, clip migration, and inadequate sampling. Informed written consent was given. The usual time-out protocol was performed immediately prior to the procedure. Lesion quadrant: Upper outer quadrant Using sterile technique and 1% Lidocaine as local anesthetic, under direct ultrasound visualization, a 12 gauge spring-loaded device was used to perform biopsy of the 2 o'clock position left breast mass using an inferior approach. At the conclusion of the procedure ribbon shaped tissue marker clip was deployed into the biopsy cavity. Follow up 2 view mammogram was performed and dictated separately. IMPRESSION: Ultrasound guided biopsy of a left breast mass. No apparent complications. Electronically Signed: By: Lajean Manes M.D. On: 10/20/2018 13:16       IMPRESSION/PLAN: 1. 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 Dr. Lisbeth Renshaw discusses the pathology findings and reviews the nature of left breast disease. The consensus from the breast conference includes breast conservation with lumpectomy with sentinel node biopsy.  Dr. Lindi Adie anticipates a role for Oncotype Dx which will render a score to determine a role for systemic therapy. Provided that chemotherapy is not indicated, the patient's course would then be followed by external radiotherapy to the left breast followed by antiestrogen therapy. We discussed the risks, benefits, short, and long term effects of radiotherapy, and the patient is interested in proceeding. Dr. Lisbeth Renshaw discusses the delivery and logistics of radiotherapy and anticipates a course of 6 1/2  weeks of radiotherapy with deep inspiration breath-hold technique. We will see her back about 2 weeks after surgery to  discuss the simulation process and anticipate we starting radiotherapy about 4-6 weeks after surgery.  2. Possible genetic predisposition to malignancy. The patient is a candidate for genetic testing given her personal  history. She was offered referral and she is interested in meeting with them. Assessment will occur in the near future. 3. Contraception. The patient is not planning on pregnancy and reports she is not at risk in her relationship. We will not need to have any urine pregnancy testing.  In a visit lasting 30 minutes, greater than 50% of the time was spent face to face discussing her case, and coordinating the patient's care.  The above documentation reflects my direct findings during this shared patient visit. Please see the separate note by Dr. Lisbeth Renshaw on this date for the remainder of the patient's plan of care.    Carola Rhine, PAC

## 2018-10-27 NOTE — Progress Notes (Signed)
REFERRING PROVIDER: Nicholas Lose, MD [place],  [place] [id number]  PRIMARY PROVIDER:  Patient, [name]  PRIMARY REASON FOR VISIT:  1. Malignant neoplasm of upper-outer quadrant of left breast in female, estrogen receptor positive ([place])   2. Family history of CLL (chronic lymphoid leukemia)      HISTORY OF PRESENT ILLNESS:   Ms. Doree Fudge, a 48 y.o. female, was seen for a [facility] cancer genetics consultation at the request of Dr. Lindi Adie due to a personal history of breast cancer.  Ms. Doree Fudge presents to clinic today to discuss the possibility of a hereditary predisposition to cancer, genetic testing, and to further clarify her future cancer risks, as well as potential cancer risks for family members.   In 2020, at the age of 34, Ms. Yow was diagnosed with IDC, ER+/PR+/Her2-, of the left breast. The treatment plan includes surgery, Oncotype DX to determine if she would benefit from chemotherapy, adjuvant radiation therapy, and adjuvant antiestrogen therapy.   CANCER HISTORY:  Oncology History  Malignant neoplasm of upper-outer quadrant of left breast in female, estrogen receptor positive ([place])  10/22/2018 Initial Diagnosis   Routine screening mammogram showed a 1.4cm mass in the left breast at the 2:00 position with no left axillary adenopathy. Biopsy showed IDC, grade 2, HER-2 equivocal (2+), ER+ 80%, PR+ 10%, Ki67 50%.   10/27/2018 Cancer Staging   Staging form: Breast, AJCC 8th Edition - Clinical stage from 10/27/2018: Stage IA (cT1c, cN0, cM0, G2, ER+, PR+, HER2-) - Signed by Nicholas Lose, MD on 10/27/2018      RISK FACTORS:  Menarche was at age 14.  First live birth at age 80.  OCP use for approximately 0 years - less than a year when she was around 36.  Ovaries intact: yes.  Hysterectomy: no.  Menopausal status: perimenopausal.  HRT use: 0 years. Colonoscopy: no. Mammogram within the last year: yes. Number of breast biopsies: 1.   Past Medical  History:  Diagnosis Date  . Anxiety   . Family history of CLL (chronic lymphoid leukemia)   . Hypoglycemia   . Migraines   . Thyroid disease    hypothyroid    No past surgical history on file.  Social History   Socioeconomic History  . Marital status: Married    Spouse name: Not on file  . Number of children: Not on file  . Years of education: Not on file  . Highest education level: Not on file  Occupational History  . Not on file  Social Needs  . Financial resource strain: Not on file  . Food insecurity    Worry: Not on file    Inability: Not on file  . Transportation needs    Medical: Not on file    Non-medical: Not on file  Tobacco Use  . Smoking status: Former Smoker    Quit date: 01/21/2011    Years since quitting: 7.7  . Smokeless tobacco: Never Used  . Tobacco comment: rare  Substance and Sexual Activity  . Alcohol use: Yes    Alcohol/week: 3.0 - 4.0 standard drinks    Types: 3 - 4 Standard drinks or equivalent per week  . Drug use: No  . Sexual activity: Yes    Partners: Female    Comment: same sex partner  Lifestyle  . Physical activity    Days per week: Not on file    Minutes per session: Not on file  . Stress: Not on file  Relationships  .  Social Herbalist on phone: Not on file    Gets together: Not on file    Attends religious service: Not on file    Active member of club or organization: Not on file    Attends meetings of clubs or organizations: Not on file    Relationship status: Not on file  Other Topics Concern  . Not on file  Social History Narrative  . Not on file     FAMILY HISTORY:  We obtained a detailed, 4-generation family history.  Significant diagnoses are listed below: Family History  Problem Relation Age of Onset  . Thyroid disease Father   . Leukemia Father 89       CLL  . Heart attack Maternal Aunt   . Heart disease Maternal Grandmother   . Breast cancer Neg Hx      Ms. Doree Fudge has one son who is 19 and has  not had cancer. She does not have any siblings.  Ms. Jessica Priest mother is currently 71. She has one maternal uncle who is approximately 30, and one maternal aunt who died from a heart attack when she was younger than 43. Her maternal grandmother died when she was 47 due to heart problems, and her maternal grandfather died at an unknown age, but older than 38. There are no known diagnoses of cancer on the maternal side of the family.  Ms. Jessica Priest father is currently 15, and he has chronic lymphocytic leukemia that was diagnosed when he was 67. She had one paternal aunt who died at an unknown age. Ms. Doree Fudge is also unsure how old her grandparents were when they died. She has limited information about this side of the family, and does not know if anyone else on her paternal side of the family had cancer.  Ms. Doree Fudge is unaware of previous family history of genetic testing for hereditary cancer risks. Patient's maternal ancestors are of [place] descent, and paternal ancestors are of [place] descent. There is no reported Ashkenazi Jewish ancestry. There is no known consanguinity.  GENETIC COUNSELING ASSESSMENT: Ms. Doree Fudge is a 48 y.o. female with a personal history of breast cancer, which is somewhat suggestive of a hereditary cancer syndrome. We, therefore, discussed and recommended the following at today's visit.   DISCUSSION: We discussed that 5 - 10% of breast cancer is hereditary, with most cases associated with BRCA1/2.  There are other genes that can be associated with hereditary breast cancer syndromes.  These include ATM, CHEK2, PALB2, etc.  We discussed that testing is beneficial for several reasons including knowing about other cancer risks, identifying potential screening and risk-reduction options that may be appropriate, and to understand if other family members could be at risk for cancer and allow them to undergo genetic testing.   We reviewed the characteristics, features and inheritance patterns of hereditary  cancer syndromes. We also discussed genetic testing, including the appropriate family members to test, the process of testing, insurance coverage and turn-around-time for results. We discussed the implications of a negative, positive and/or variant of uncertain significant result. In order to get genetic test results in a timely manner so that Ms. Yow can use these genetic test results for surgical decisions, we recommended Ms. Yow pursue genetic testing for the [facility] Breast Cancer STAT panel. Once complete, we recommend Ms. Yow pursue reflex genetic testing to the Common Hereditary Cancers panel.   Based on Ms. Yow's personal history of cancer and her limited paternal family history, she meets medical  criteria for genetic testing. Despite that she meets criteria, she may still have an out of pocket cost.   PLAN: After considering the risks, benefits, and limitations, Ms. Yow provided informed consent to pursue genetic testing and the blood sample was sent to [facility] for analysis of the Breast Cancer STAT panel + Common Hereditary Cancers panel. Results should be available within approximately one weeks' time, at which point they will be disclosed by telephone to Ms. Yow, as will any additional recommendations warranted by these results. Ms. Doree Fudge will receive a summary of her genetic counseling visit and a copy of her results once available. This information will also be available in Epic.   Ms. Jessica Priest questions were answered to her satisfaction today. Our contact information was provided should additional questions or concerns arise. Thank you for the referral and allowing [place] to share in the care of your patient.   Clint Guy, MS, [facility] Certified Genetic Counselor [email or web address] Phone: [phone]  The patient was seen for a total of 15 minutes in face-to-face genetic counseling.  This patient was discussed with Drs. Magrinat, Lindi Adie and/or Burr Medico who agrees with the above.     _______________________________________________________________________ For Office Staff:  Number of people involved in session: 2 Was an Intern/ student involved with case: yes

## 2018-10-27 NOTE — Patient Instructions (Signed)

## 2018-10-27 NOTE — Assessment & Plan Note (Signed)
10/22/18: Routine screening mammogram showed a 1.4cm mass in the left breast at the 2:00 position with no left axillary adenopathy. Biopsy showed IDC, grade 2, HER-2 equivocal (2+), ER+ 80%, PR+ 10%, Ki67 50%.  Pathology and radiology counseling:Discussed with the patient, the details of pathology including the type of breast cancer,the clinical staging, the significance of ER, PR and HER-2/neu receptors and the implications for treatment. After reviewing the pathology in detail, we proceeded to discuss the different treatment options between surgery, radiation, chemotherapy, antiestrogen therapies.  Recommendations: 1. Breast conserving surgery followed by 2. Oncotype DX testing to determine if chemotherapy would be of any benefit followed by 3. Adjuvant radiation therapy followed by 4. Adjuvant antiestrogen therapy  Oncotype counseling: I discussed Oncotype DX test. I explained to the patient that this is a 21 gene panel to evaluate patient tumors DNA to calculate recurrence score. This would help determine whether patient has high risk or intermediate risk or low risk breast cancer. She understands that if her tumor was found to be high risk, she would benefit from systemic chemotherapy. If low risk, no need of chemotherapy. If she was found to be intermediate risk, we would need to evaluate the score as well as other risk factors and determine if an abbreviated chemotherapy may be of benefit.  Return to clinic after surgery to discuss final pathology report and then determine if Oncotype DX testing will need to be sent.

## 2018-10-27 NOTE — Therapy (Signed)
[place], [place], [address] Phone: [phone]   Fax:  [phone]  Physical Therapy Evaluation  Patient Details  Name: Courtney Williams MRN: [id number] Date of Birth: Nov 28, 1970 Referring Provider (PT): Dr. Rolm Bookbinder   Encounter Date: 10/27/2018  PT End of Session - 10/27/18 1518    Visit Number  1    Number of Visits  2    Date for PT Re-Evaluation  12/22/18    PT Start Time  2197    PT Stop Time  1450    PT Time Calculation (min)  28 min    Activity Tolerance  Patient tolerated treatment well    Behavior During Therapy  [facility] for tasks assessed/performed       Past Medical History:  Diagnosis Date  . Anxiety   . Hypoglycemia   . Migraines   . Thyroid disease    hypothyroid    History reviewed. No pertinent surgical history.  There were no vitals filed for this visit.   Subjective Assessment - 10/27/18 1509    Subjective  Patient reports she is here today to be seen by her medical team for her newly diagnosed left breast cancer.    Pertinent History  Patient was diagnosed on 10/11/2018 with left grade II invasive ductal carcinoma breast cancer. It measures 1.4 cm and is located in the upper outer quadrant. It is ER/PR positive and HER2 negative with a Ki67 of 15%.    Patient Stated Goals  Reduce lymphedema risk and learn post op shoulder ROM HEP    Currently in Pain?  No/denies         [facility] PT Assessment - 10/27/18 0001      Assessment   Medical Diagnosis  Left breast cancer    Referring Provider (PT)  Dr. Rolm Bookbinder    Onset Date/Surgical Date  10/11/18    Hand Dominance  Right    Prior Therapy  none      Precautions   Precautions  Other (comment)    Precaution Comments  active cancer      Restrictions   Weight Bearing Restrictions  No      Balance Screen   Has the patient fallen in the past 6 months  No    Has the patient had a decrease in activity level because  of a fear of falling?   No    Is the patient reluctant to leave their home because of a fear of falling?   No      Home Film/video editor residence    Living Arrangements  Spouse/significant other   Lives with her wife   Available Help at Discharge  Family      Prior Function   Level of Independence  Independent    Vocation  Full time employment    Herbalist    Leisure  She does not exercise      Cognition   Overall Cognitive Status  Within Functional Limits for tasks assessed      Posture/Postural Control   Posture/Postural Control  Postural limitations    Postural Limitations  Rounded Shoulders;Forward head      ROM / Strength   AROM / PROM / Strength  AROM;Strength      AROM   AROM Assessment Site  Shoulder;Cervical    Right/Left Shoulder  Right;Left    Right Shoulder Extension  50 Degrees    Right Shoulder Flexion  155 Degrees    Right Shoulder ABduction  172 Degrees    Right Shoulder Internal Rotation  60 Degrees    Right Shoulder External Rotation  90 Degrees    Left Shoulder Extension  46 Degrees    Left Shoulder Flexion  159 Degrees    Left Shoulder ABduction  172 Degrees    Left Shoulder Internal Rotation  58 Degrees    Left Shoulder External Rotation  88 Degrees    Cervical Flexion  WNL    Cervical Extension  WNL    Cervical - Right Side Bend  25% limited    Cervical - Left Side Bend  25% limited    Cervical - Right Rotation  25% limited    Cervical - Left Rotation  25% limited      Strength   Overall Strength  Within functional limits for tasks performed        LYMPHEDEMA/ONCOLOGY QUESTIONNAIRE - 10/27/18 1517      Type   Cancer Type  Left breast cancer      Lymphedema Assessments   Lymphedema Assessments  Upper extremities      Right Upper Extremity Lymphedema   10 cm Proximal to Olecranon Process  28.8 cm    Olecranon Process  24.2 cm    10 cm Proximal to Ulnar Styloid Process  24 cm    Just  Proximal to Ulnar Styloid Process  14.9 cm    Across Hand at [employer]  18.1 cm    At [place] of 2nd Digit  6.1 cm      Left Upper Extremity Lymphedema   10 cm Proximal to Olecranon Process  29.1 cm    Olecranon Process  24.5 cm    10 cm Proximal to Ulnar Styloid Process  23.2 cm    Just Proximal to Ulnar Styloid Process  15 cm    Across Hand at [employer]  18 cm    At [place] of 2nd Digit  5.8 cm          Quick Dash - 10/27/18 0001    Open a tight or new jar  No difficulty    Do heavy household chores (wash walls, wash floors)  No difficulty    Carry a shopping bag or briefcase  No difficulty    Wash your back  No difficulty    Use a knife to cut food  No difficulty    Recreational activities in which you take some force or impact through your arm, shoulder, or hand (golf, hammering, tennis)  No difficulty    During the past week, to what extent has your arm, shoulder or hand problem interfered with your normal social activities with family, friends, neighbors, or groups?  Not at all    During the past week, to what extent has your arm, shoulder or hand problem limited your work or other regular daily activities  Not at all    Arm, shoulder, or hand pain.  None    Tingling (pins and needles) in your arm, shoulder, or hand  None    Difficulty Sleeping  No difficulty    DASH Score  0 %        Objective measurements completed on examination: See above findings.        Patient was instructed today in a home exercise program today for post op shoulder range of motion. These included active assist shoulder flexion in sitting, scapular retraction, wall walking with shoulder abduction, and  hands behind head external rotation.  She was encouraged to do these twice a day, holding 3 seconds and repeating 5 times when permitted by her physician.          PT Education - 10/27/18 1517    Education Details  Lymphedema risk reduction and post op shoulder ROM HEP    Person(s)  Educated  Patient    Methods  Explanation;Demonstration;Handout    Comprehension  Returned demonstration;Verbalized understanding          PT Long Term Goals - 10/27/18 1521      PT LONG TERM GOAL #1   Title  Patient will demonstrate she has regained full shoulder ROM and function post operatively compared ot baseline assessments.    Time  8    Period  Weeks    Status  New      Breast Clinic Goals - 10/27/18 1521      Patient will be able to verbalize understanding of pertinent lymphedema risk reduction practices relevant to her diagnosis specifically related to skin care.   Time  1    Period  Days    Status  Achieved      Patient will be able to return demonstrate and/or verbalize understanding of the post-op home exercise program related to regaining shoulder range of motion.   Time  1    Period  Days    Status  Achieved      Patient will be able to verbalize understanding of the importance of attending the postoperative After Breast Cancer Class for further lymphedema risk reduction education and therapeutic exercise.   Time  1    Period  Days    Status  Achieved            Plan - 10/27/18 1519    Clinical Impression Statement  Patient was diagnosed on 10/11/2018 with left grade II invasive ductal carcinoma breast cancer. It measures 1.4 cm and is located in the upper outer quadrant. It is ER/PR positive and HER2 negative with a Ki67 of 15%. Her multidisciplinary medical team met prior to her assessments to determine a recommended treatment plan. She is planning to have a left lumpectomy and sentinel node biopsy followed by Oncotype testing, radiation, and anti-estrogen therapy. She will benefit from post op PT to regain shoulder ROM and reduce lympehdema risk.    Stability/Clinical Decision Making  Stable/Uncomplicated    Clinical Decision Making  Low    Rehab Potential  Excellent    PT Frequency  --   Eval and 1 f/u visit   PT Treatment/Interventions  ADLs/Self  Care Home Management;Therapeutic exercise;Patient/family education    PT Next Visit Plan  Will reassess 3-4 weeks post op to determine needs    PT Home Exercise Plan  Post op shoulder ROM HEP    Consulted and Agree with Plan of Care  Patient       Patient will benefit from skilled therapeutic intervention in order to improve the following deficits and impairments:  Decreased knowledge of precautions, Impaired UE functional use, Pain, Postural dysfunction, Decreased range of motion  Visit Diagnosis: Malignant neoplasm of upper-outer quadrant of left breast in female, estrogen receptor positive ([place]) - Plan: PT plan of care cert/re-cert  Abnormal posture - Plan: PT plan of care cert/re-cert   Patient will follow up at outpatient cancer rehab 3-4 weeks following surgery.  If the patient requires physical therapy at that time, a specific plan will be dictated and sent to the referring  physician for approval. The patient was educated today on appropriate basic range of motion exercises to begin post operatively and the importance of attending the After Breast Cancer class following surgery.  Patient was educated today on lymphedema risk reduction practices as it pertains to recommendations that will benefit the patient immediately following surgery.  She verbalized good understanding.      Problem List Patient Active Problem List   Diagnosis Date Noted  . Malignant neoplasm of upper-outer quadrant of left breast in female, estrogen receptor positive ([place]) 10/22/2018  . Unspecified hypothyroidism 05/03/2013   Annia Friendly, PT 10/27/18 3:23 PM  [place], [place], [address] Phone: [phone]   Fax:  [phone]  Name: Courtney Williams MRN: [id number] Date of Birth: 08/19/1970

## 2018-10-27 NOTE — Progress Notes (Signed)
I connected with Courtney Williams on 10/27/2018 at 2:45pm EDT by Webex video conference and verified that I am speaking with the correct person using two identifiers.   Patient location: clinic Provider location: clinic

## 2018-10-28 ENCOUNTER — Telehealth: Payer: Self-pay | Admitting: Hematology and Oncology

## 2018-10-28 ENCOUNTER — Other Ambulatory Visit: Payer: Self-pay | Admitting: General Surgery

## 2018-10-28 DIAGNOSIS — C50412 Malignant neoplasm of upper-outer quadrant of left female breast: Secondary | ICD-10-CM

## 2018-10-28 NOTE — Telephone Encounter (Signed)
No 10/7 los.   

## 2018-10-28 NOTE — Progress Notes (Signed)
[place] Psychosocial Distress Screening Spiritual Care  Met with Courtney Williams in [facility] to introduce [facility] team/resources, reviewing distress screen per protocol.  The patient scored a 2 on the Psychosocial Distress Thermometer which indicates mild distress. Also assessed for distress and other psychosocial needs.   ON[facility] DISTRESS SCREENING 10/28/2018  Screening Type Initial Screening  Distress experienced in past week (1-10) 2  Emotional problem type Nervousness/Anxiety  Referral to support programs Yes   [name] reports good support from wife Courtney Williams and is relieved to learn about the scope of dx/tx. Introduced [facility] team/programming resources. Provided empathic listening, normalization of feelings, and encouragement to consider support programming.  Follow up needed: No. Per pt, no other needs or concerns at this time, but she knows to contact Team whenever needed/desired. Please also page if needs arise or circumstances change. Thank you!   [name], [place], [facility] Pager [phone] Voicemail [phone]

## 2018-11-03 NOTE — Pre-Procedure Instructions (Signed)
[id number] - [place], [place] [address] [place] [address] Phone: [phone] Fax: [phone]      Your procedure is scheduled on  11-10-18  Report to [facility] Main Entrance "A" at 1230 PM., and check in at the Admitting office.  Call this number if you have problems the morning of surgery:  [phone]  Call [phone] if you have any questions prior to your surgery date Monday-Friday 8am-4pm    Remember:  Do not eat  after midnight the night before your surgery  You may drink clear liquids until 1130AM he morning of your surgery.   Clear liquids allowed are: Water, Non-Citrus Juices (without pulp), Carbonated Beverages, Clear Tea, Black Coffee Only, and Gatorade  Please complete your PRE-SURGERY ENSURE that was provided to you by .1130AM  the morning of surgery.  Please, if able, drink it in one setting. DO NOT SIP.   Take these medicines the morning of surgery with A SIP OF WATER : levothyroxine (SYNTHROID fexofenadine (ALLEGRA) as needed  7 days prior to surgery STOP taking any Aspirin (unless otherwise instructed by your surgeon), Aleve, Naproxen, Ibuprofen, Motrin, Advil, Goody's, BC's, all herbal medications, fish oil, and all vitamins.    The Morning of Surgery  Do not wear jewelry, make-up or nail polish.  Do not wear lotions, powders, or perfumes/colognes, or deodorant  Do not shave 48 hours prior to surgery.  Do not bring valuables to the hospital.  [facility] is not responsible for any belongings or valuables.  If you are a smoker, DO NOT Smoke 24 hours prior to surgery IF you wear a CPAP at night please bring your mask, tubing, and machine the morning of surgery   Remember that you must have someone to transport you home after your surgery, and remain with you for 24 hours if you are discharged the same day.   Contacts, glasses, hearing aids, dentures or bridgework may not be worn  into surgery.    Leave your suitcase in the car.  After surgery it may be brought to your room.  For patients admitted to the hospital, discharge time will be determined by your treatment team.  Patients discharged the day of surgery will not be allowed to drive home.    Special instructions:   [facility]- Preparing For Surgery  Before surgery, you can play an important role. Because skin is not sterile, your skin needs to be as free of germs as possible. You can reduce the number of germs on your skin by washing with CHG (chlorahexidine gluconate) Soap before surgery.  CHG is an antiseptic cleaner which kills germs and bonds with the skin to continue killing germs even after washing.    Oral Hygiene is also important to reduce your risk of infection.  Remember - BRUSH YOUR TEETH THE MORNING OF SURGERY WITH YOUR REGULAR TOOTHPASTE  Please do not use if you have an allergy to CHG or antibacterial soaps. If your skin becomes reddened/irritated stop using the CHG.  Do not shave (including legs and underarms) for at least 48 hours prior to first CHG shower. It is OK to shave your face.  Please follow these instructions carefully.   1. Shower the NIGHT BEFORE SURGERY and the MORNING OF SURGERY with CHG Soap.   2. If you chose to wash your hair, wash your hair first as usual with your normal shampoo.  3. After you shampoo, rinse  your hair and body thoroughly to remove the shampoo.  4. Use CHG as you would any other liquid soap. You can apply CHG directly to the skin and wash gently with a scrungie or a clean washcloth.   5. Apply the CHG Soap to your body ONLY FROM THE NECK DOWN.  Do not use on open wounds or open sores. Avoid contact with your eyes, ears, mouth and genitals (private parts). Wash Face and genitals (private parts)  with your normal soap.   6. Wash thoroughly, paying special attention to the area where your surgery will be performed.  7. Thoroughly rinse your body with  warm water from the neck down.  8. DO NOT shower/wash with your normal soap after using and rinsing off the CHG Soap.  9. Pat yourself dry with a CLEAN TOWEL.  10. Wear CLEAN PAJAMAS to bed the night before surgery, wear comfortable clothes the morning of surgery  11. Place CLEAN SHEETS on your bed the night of your first shower and DO NOT SLEEP WITH PETS.  Day of Surgery:  Do not apply any deodorants/lotions. Please shower the morning of surgery with the CHG soap  Please wear clean clothes to the hospital/surgery center.   Remember to brush your teeth WITH YOUR REGULAR TOOTHPASTE.   Please read over the  fact sheets that you were given.

## 2018-11-04 ENCOUNTER — Telehealth: Payer: Self-pay | Admitting: *Deleted

## 2018-11-04 ENCOUNTER — Encounter (HOSPITAL_COMMUNITY): Payer: Self-pay

## 2018-11-04 ENCOUNTER — Other Ambulatory Visit: Payer: Self-pay

## 2018-11-04 ENCOUNTER — Encounter (HOSPITAL_COMMUNITY)
Admission: RE | Admit: 2018-11-04 | Discharge: 2018-11-04 | Disposition: A | Discharge status: Home / Self Care | Payer: 59 | Source: Ambulatory Visit | Attending: General Surgery | Admitting: General Surgery

## 2018-11-04 DIAGNOSIS — Z87891 Personal history of nicotine dependence: Secondary | ICD-10-CM | POA: Insufficient documentation

## 2018-11-04 DIAGNOSIS — Z79890 Hormone replacement therapy (postmenopausal): Secondary | ICD-10-CM | POA: Insufficient documentation

## 2018-11-04 DIAGNOSIS — Z806 Family history of leukemia: Secondary | ICD-10-CM | POA: Diagnosis not present

## 2018-11-04 DIAGNOSIS — Z01818 Encounter for other preprocedural examination: Secondary | ICD-10-CM | POA: Diagnosis not present

## 2018-11-04 DIAGNOSIS — C50912 Malignant neoplasm of unspecified site of left female breast: Secondary | ICD-10-CM | POA: Diagnosis not present

## 2018-11-04 DIAGNOSIS — E039 Hypothyroidism, unspecified: Secondary | ICD-10-CM | POA: Insufficient documentation

## 2018-11-04 HISTORY — DX: Hypothyroidism, unspecified: E03.9

## 2018-11-04 HISTORY — DX: Malignant neoplasm of unspecified site of unspecified female breast: C50.919

## 2018-11-04 LAB — CBC
HCT: 42.9 % (ref 36.0–46.0)
Hemoglobin: 13.8 g/dL (ref 12.0–15.0)
MCH: 30.5 pg (ref 26.0–34.0)
MCHC: 32.2 g/dL (ref 30.0–36.0)
MCV: 94.7 fL (ref 80.0–100.0)
Platelets: 376 10*3/uL (ref 150–400)
RBC: 4.53 MIL/uL (ref 3.87–5.11)
RDW: 13.1 % (ref 11.5–15.5)
WBC: 8.9 10*3/uL (ref 4.0–10.5)
nRBC: 0 % (ref 0.0–0.2)

## 2018-11-04 LAB — COMPREHENSIVE METABOLIC PANEL
ALT: 16 U/L (ref 0–44)
AST: 17 U/L (ref 15–41)
Albumin: 3.8 g/dL (ref 3.5–5.0)
Alkaline Phosphatase: 42 U/L (ref 38–126)
Anion gap: 9 (ref 5–15)
BUN: 9 mg/dL (ref 6–20)
CO2: 27 mmol/L (ref 22–32)
Calcium: 9 mg/dL (ref 8.9–10.3)
Chloride: 104 mmol/L (ref 98–111)
Creatinine, Ser: 0.58 mg/dL (ref 0.44–1.00)
GFR calc Af Amer: >60 mL/min (ref >60)
GFR calc non Af Amer: >60 mL/min (ref >60)
Glucose, Bld: 86 mg/dL (ref 70–99)
Potassium: 3.5 mmol/L (ref 3.5–5.1)
Sodium: 140 mmol/L (ref 135–145)
Total Bilirubin: 0.9 mg/dL (ref 0.3–1.2)
Total Protein: 6.6 g/dL (ref 6.5–8.1)

## 2018-11-04 LAB — POCT PREGNANCY, URINE: Preg Test, Ur: NEGATIVE

## 2018-11-04 NOTE — Progress Notes (Addendum)
PCP - denies  Chest x-ray - n/a EKG - n/a  ERAS Protcol - yes, Ensure given   COVID TEST- Saturday, Oct. 17th   Anesthesia review: yes Seed Placement - Tuesday, Oct. 20th PCOT - Urine preg done at PAT appointment   Patient denies shortness of breath, fever, cough and chest pain at PAT appointment   All instructions explained to the patient, with a verbal understanding of the material. Patient agrees to go over the instructions while at home for a better understanding. Patient also instructed to self quarantine after being tested for COVID-19. The opportunity to ask questions was provided.

## 2018-11-04 NOTE — Telephone Encounter (Signed)
Spoke with patient to assess navigation needs or concerns.  No questions or concerns at this time.

## 2018-11-05 NOTE — Anesthesia Preprocedure Evaluation (Addendum)
Anesthesia Evaluation  Patient identified by MRN, date of birth, ID band Patient awake    Reviewed: Allergy & Precautions, NPO status , Patient's Chart, lab work & pertinent test results  Airway Mallampati: II  TM Distance: >3 FB Neck ROM: Full    Dental no notable dental hx.    Pulmonary neg pulmonary ROS, Current Smoker and Patient abstained from smoking.,    Pulmonary exam normal breath sounds clear to auscultation       Cardiovascular negative cardio ROS Normal cardiovascular exam Rhythm:Regular Rate:Normal     Neuro/Psych  Headaches, Anxiety negative psych ROS   GI/Hepatic negative GI ROS, Neg liver ROS,   Endo/Other  Hypothyroidism   Renal/GU negative Renal ROS  negative genitourinary   Musculoskeletal negative musculoskeletal ROS (+)   Abdominal (+) + obese,   Peds negative pediatric ROS (+)  Hematology negative hematology ROS (+)   Anesthesia Other Findings Breast Cancer  Reproductive/Obstetrics negative OB ROS                            Anesthesia Physical Anesthesia Plan  ASA: III  Anesthesia Plan: General   Post-op Pain Management:  Regional for Post-op pain   Induction: Intravenous  PONV Risk Score and Plan: 2 and Ondansetron, Midazolam and Treatment may vary due to age or medical condition  Airway Management Planned: LMA  Additional Equipment:   Intra-op Plan:   Post-operative Plan: Extubation in OR  Informed Consent: I have reviewed the patients History and Physical, chart, labs and discussed the procedure including the risks, benefits and alternatives for the proposed anesthesia with the patient or authorized representative who has indicated his/her understanding and acceptance.     Dental advisory given  Plan Discussed with: CRNA  Anesthesia Plan Comments: (PAT note written by Myra Gianotti, PA-C. )       Anesthesia Quick Evaluation

## 2018-11-05 NOTE — Progress Notes (Signed)
Anesthesia Chart Review:  Case: [id number] Date/Time: 11/10/18 1414   Procedure: LEFT BREAST LUMPECTOMY WITH RADIOACTIVE SEED AND LEFT AXILLARY SENTINEL LYMPH NODE BIOPSY (Left Breast)   Anesthesia type: General   Pre-op diagnosis: LEFT BREAST CANCER   Location: [place] OR ROOM 10 / [facility]   Surgeon: Rolm Bookbinder, MD    RSL scheduled for 11/09/18 at 2:15 PM.  DISCUSSION: Patient is a 48 year old female scheduled for the above procedure. Abnormal routine mammogram 10/11/18; biopsy on 10/20/18 showed invasive ductal carcinoma, grade 2, HER-2 equivocal (2+), ER+ 80%, PR+ 10%, Ki67 50%.   History includes smoking, left breast cancer, hypothyroidism, migraines, anxiety, hypoglycemia.   Preoperative labs acceptable.  Urine pregnancy test done (negative) at PAT since needed prior to RSL.  She denied shortness of breath, cough, fever and chest pain at PAT RN visit.  COVID-19 test is scheduled for 11/06/18.  If negative and otherwise no acute changes and I would anticipate that she could proceed as planned.   VS: BP 111/63   Pulse 75   Temp (!) 36.3 C (Tympanic)   Resp 18   Ht 5' 1.75" (1.568 m)   Wt 74.6 kg   LMP  (LMP Unknown)   SpO2 99%   BMI 30.31 kg/m   PROVIDERS: Patient, [name]. Receives care through Urgent Care or through [facility] [place], [place], [place]). Nicholas Lose, MD is HEM-ONC. Plans for Oncotype DX test to held determine if patient will need post-surgery chemotherapy.    LABS: Labs reviewed: Acceptable for surgery. (all labs ordered are listed, but only abnormal results are displayed)  Labs Reviewed  COMPREHENSIVE METABOLIC PANEL  CBC  POCT PREGNANCY, URINE    EKG: N/A   CV: N/A  Past Medical History:  Diagnosis Date  . Anxiety   . Breast cancer ([place])    Left Breast  . Family history of CLL (chronic lymphoid leukemia)   . Hypoglycemia   . Hypothyroidism   . Migraines   . Thyroid disease    hypothyroid    History reviewed. No  pertinent surgical history.  MEDICATIONS: . fexofenadine (ALLEGRA) 180 MG tablet  . levothyroxine (SYNTHROID) 125 MCG tablet   No current facility-administered medications for this encounter.     Myra Gianotti, PA-C Surgical Short Stay/Anesthesiology [facility] Phone [phone] [facility] Phone [phone] 11/05/2018 5:36 PM

## 2018-11-06 ENCOUNTER — Other Ambulatory Visit (HOSPITAL_COMMUNITY)
Admission: RE | Admit: 2018-11-06 | Discharge: 2018-11-06 | Disposition: A | Discharge status: Home / Self Care | Payer: 59 | Source: Ambulatory Visit | Attending: General Surgery | Admitting: General Surgery

## 2018-11-06 DIAGNOSIS — Z20828 Contact with and (suspected) exposure to other viral communicable diseases: Secondary | ICD-10-CM | POA: Insufficient documentation

## 2018-11-06 DIAGNOSIS — Z01812 Encounter for preprocedural laboratory examination: Secondary | ICD-10-CM | POA: Diagnosis not present

## 2018-11-07 LAB — NOVEL CORONAVIRUS, NAA (HOSP ORDER, SEND-OUT TO REF LAB; TAT 18-24 HRS): SARS-CoV-2, NAA: NOT DETECTED

## 2018-11-08 ENCOUNTER — Ambulatory Visit: Payer: Self-pay | Admitting: Genetic Counselor

## 2018-11-08 ENCOUNTER — Telehealth: Payer: Self-pay | Admitting: Hematology and Oncology

## 2018-11-08 ENCOUNTER — Encounter: Payer: Self-pay | Admitting: Genetic Counselor

## 2018-11-08 ENCOUNTER — Telehealth: Payer: Self-pay | Admitting: Genetic Counselor

## 2018-11-08 DIAGNOSIS — Z1379 Encounter for other screening for genetic and chromosomal anomalies: Secondary | ICD-10-CM | POA: Insufficient documentation

## 2018-11-08 NOTE — Telephone Encounter (Signed)
Revealed negative genetic testing.  Discussed that we do not know why she has breast cancer. It could be due to a different gene that we are not testing, or our current technology may not be able to detect something.  It will be important for her to keep in contact with genetics to keep up with whether additional testing may be needed.

## 2018-11-08 NOTE — Progress Notes (Signed)
HPI:  Courtney Williams was previously seen in the [facility] due to a personal history of breast cancer and concerns regarding a hereditary predisposition to cancer. Please refer to our prior cancer genetics clinic note for more information regarding our discussion, assessment and recommendations, at the time. Courtney Williams recent genetic test results were disclosed to her, as were recommendations warranted by these results. These results and recommendations are discussed in more detail below.  CANCER HISTORY:  Oncology History  Malignant neoplasm of upper-outer quadrant of left breast in female, estrogen receptor positive ([place])  10/22/2018 Initial Diagnosis   Routine screening mammogram showed a 1.4cm mass in the left breast at the 2:00 position with no left axillary adenopathy. Biopsy showed IDC, grade 2, HER-2 equivocal (2+), ER+ 80%, PR+ 10%, Ki67 50%.   10/27/2018 Cancer Staging   Staging form: Breast, AJCC 8th Edition - Clinical stage from 10/27/2018: Stage IA (cT1c, cN0, cM0, G2, ER+, PR+, HER2-) - Signed by Nicholas Lose, MD on 10/27/2018   11/05/2018 Genetic Testing   Negative genetic testing: no pathogenic variants detected on the [facility] Breast Cancer STAT panel and Common Hereditary Cancers Panel. The report date is 11/05/2018.  The Breast Cancer STAT panel offered by [facility] includes sequencing and rearrangement analysis for the following 9 genes:  ATM, BRCA1, BRCA2, CDH1, CHEK2, PALB2, PTEN, STK11 and TP53.  The Common Hereditary Gene Panel offered by [facility] includes sequencing and/or deletion duplication testing of the following 48 genes: APC, ATM, AXIN2, BARD1, BMPR1A, BRCA1, BRCA2, BRIP1, CDH1, CDK4, CDKN2A (p14ARF), CDKN2A (p16INK4a), CHEK2, CTNNA1, DICER1, EPCAM (Deletion/duplication testing only), GREM1 (promoter region deletion/duplication testing only), KIT, MEN1, MLH1, MSH2, MSH3, MSH6, MUTYH, NBN, NF1, NHTL1, PALB2, PDGFRA, PMS2, POLD1, POLE, PTEN, RAD50, RAD51C,  RAD51D, RNF43, SDHB, SDHC, SDHD, SMAD4, SMARCA4. STK11, TP53, TSC1, TSC2, and VHL.  The following genes were evaluated for sequence changes only: SDHA and HOXB13 c.251G>A variant only.     FAMILY HISTORY:  We obtained a detailed, 4-generation family history.  Significant diagnoses are listed below: Family History  Problem Relation Age of Onset   Thyroid disease Father    Leukemia Father 78       CLL   Heart attack Maternal Aunt    Heart disease Maternal Grandmother    Breast cancer Neg Hx      Ms. Doree Fudge has one son who is 31 and has not had cancer. She does not have any siblings.  Courtney Williams mother is currently 13. She has one maternal uncle who is approximately 52, and one maternal aunt who died from a heart attack when she was younger than 45. Her maternal grandmother died when she was 43 due to heart problems, and her maternal grandfather died at an unknown age, but older than 23. There are no known diagnoses of cancer on the maternal side of the family.  Courtney Williams father is currently 28, and he has chronic lymphocytic leukemia that was diagnosed when he was 83. She had one paternal aunt who died at an unknown age. Ms. Doree Fudge is also unsure how old her grandparents were when they died. She has limited information about this side of the family, and does not know if anyone else on her paternal side of the family had cancer.  Ms. Doree Fudge is unaware of previous family history of genetic testing for hereditary cancer risks. Patient's maternal ancestors are of [place] descent, and paternal ancestors are of [place] descent. There is no reported Ashkenazi Jewish ancestry. There is  no known consanguinity.  GENETIC TEST RESULTS: Genetic testing reported out on 11/05/2018 through the [facility] Common Hereditary Cancers panel found no pathogenic variants. The Common Hereditary Cancers Panel offered by [facility] includes sequencing and/or deletion duplication testing of the following 48 genes: APC, ATM,  AXIN2, BARD1, BMPR1A, BRCA1, BRCA2, BRIP1, CDH1, CDK4, CDKN2A (p14ARF), CDKN2A (p16INK4a), CHEK2, CTNNA1, DICER1, EPCAM (Deletion/duplication testing only), GREM1 (promoter region deletion/duplication testing only), KIT, MEN1, MLH1, MSH2, MSH3, MSH6, MUTYH, NBN, NF1, NHTL1, PALB2, PDGFRA, PMS2, POLD1, POLE, PTEN, RAD50, RAD51C, RAD51D, RNF43, SDHB, SDHC, SDHD, SMAD4, SMARCA4. STK11, TP53, TSC1, TSC2, and VHL.  The following genes were evaluated for sequence changes only: SDHA and HOXB13 c.251G>A variant only. The test report will be scanned into EPIC and located under the Molecular Pathology section of the Results Review tab.  A portion of the result report is included below for reference.     We discussed with Courtney Williams that because current genetic testing is not perfect, it is possible there may be a gene mutation in one of these genes that current testing cannot detect, but that chance is small.  We also discussed, that there could be another gene that has not yet been discovered, or that we have not yet tested, that is responsible for the cancer diagnoses in the family. It is also possible there is a hereditary cause for the cancer in the family that Courtney Williams did not inherit and therefore was not identified in her testing.  Therefore, it is important to remain in touch with cancer genetics in the future so that we can continue to offer Courtney Williams the most up to date genetic testing.   CANCER SCREENING RECOMMENDATIONS: Courtney Williams test result is considered negative (normal).  This means that we have not identified a hereditary cause for her personal and family history of cancer at this time. Most cancers happen by chance and this negative test suggests that her cancer may fall into this category.    While reassuring, this does not definitively rule out a hereditary predisposition to cancer. It is still possible that there could be genetic mutations that are undetectable by current technology. There could be  genetic mutations in genes that have not been tested or identified to increase cancer risk.  Therefore, it is recommended she continue to follow the cancer management and screening guidelines provided by her oncology and primary healthcare provider.   An individual's cancer risk and medical management are not determined by genetic test results alone. Overall cancer risk assessment incorporates additional factors, including personal medical history, family history, and any available genetic information that may result in a personalized plan for cancer prevention and surveillance.  RECOMMENDATIONS FOR FAMILY MEMBERS:  Individuals in this family might be at some increased risk of developing cancer, over the general population risk, simply due to the family history of cancer.  We recommended women in this family have a yearly mammogram beginning at age 34, or 33 years younger than the earliest onset of cancer, an annual clinical breast exam, and perform monthly breast self-exams. Women in this family should also have a gynecological exam as recommended by their primary provider. All family members should have a colonoscopy by age 25.  FOLLOW-UP: Lastly, we discussed with Courtney Williams that cancer genetics is a rapidly advancing field and it is possible that new genetic tests will be appropriate for her and/or her family members in the future. We encouraged her to remain in contact with cancer genetics on an annual  basis so we can update her personal and family histories and let her know of advances in cancer genetics that may benefit this family.   Our contact number was provided. Courtney Williams questions were answered to her satisfaction, and she knows she is welcome to call us at anytime with additional questions or concerns.   Clint Guy, MS, [facility] Certified Genetic Counselor [email or web address] Phone: [phone]

## 2018-11-08 NOTE — Telephone Encounter (Signed)
Scheduled appt per 10/15 sch message - pt aware of apt date and time  - no open slot 10/28 , scheduled next available

## 2018-11-09 ENCOUNTER — Encounter: Payer: Self-pay | Admitting: *Deleted

## 2018-11-09 ENCOUNTER — Other Ambulatory Visit: Payer: Self-pay

## 2018-11-09 ENCOUNTER — Ambulatory Visit
Admission: RE | Admit: 2018-11-09 | Discharge: 2018-11-09 | Disposition: A | Discharge status: Home / Self Care | Payer: 59 | Source: Ambulatory Visit | Attending: General Surgery | Admitting: General Surgery

## 2018-11-09 DIAGNOSIS — C50412 Malignant neoplasm of upper-outer quadrant of left female breast: Secondary | ICD-10-CM

## 2018-11-09 DIAGNOSIS — Z170 Estrogen receptor positive status [ER+]: Secondary | ICD-10-CM

## 2018-11-10 ENCOUNTER — Encounter (HOSPITAL_COMMUNITY)
Admission: RE | Disposition: A | Discharge status: Home / Self Care | Payer: Self-pay | Source: Home / Self Care | Attending: General Surgery

## 2018-11-10 ENCOUNTER — Ambulatory Visit (HOSPITAL_COMMUNITY): Payer: 59 | Admitting: Certified Registered"

## 2018-11-10 ENCOUNTER — Ambulatory Visit (HOSPITAL_COMMUNITY): Payer: 59 | Admitting: Vascular Surgery

## 2018-11-10 ENCOUNTER — Ambulatory Visit (HOSPITAL_COMMUNITY)
Admission: RE | Admit: 2018-11-10 | Discharge: 2018-11-10 | Disposition: A | Discharge status: Home / Self Care | Payer: 59 | Source: Ambulatory Visit | Attending: General Surgery | Admitting: General Surgery

## 2018-11-10 ENCOUNTER — Ambulatory Visit
Admission: RE | Admit: 2018-11-10 | Discharge: 2018-11-10 | Disposition: A | Discharge status: Home / Self Care | Payer: 59 | Source: Ambulatory Visit | Attending: General Surgery | Admitting: General Surgery

## 2018-11-10 ENCOUNTER — Encounter (HOSPITAL_COMMUNITY): Payer: Self-pay | Admitting: *Deleted

## 2018-11-10 ENCOUNTER — Ambulatory Visit (HOSPITAL_COMMUNITY)
Admission: RE | Admit: 2018-11-10 | Discharge: 2018-11-10 | Disposition: A | Discharge status: Home / Self Care | Payer: 59 | Attending: General Surgery | Admitting: General Surgery

## 2018-11-10 DIAGNOSIS — C773 Secondary and unspecified malignant neoplasm of axilla and upper limb lymph nodes: Secondary | ICD-10-CM | POA: Diagnosis not present

## 2018-11-10 DIAGNOSIS — F17200 Nicotine dependence, unspecified, uncomplicated: Secondary | ICD-10-CM | POA: Insufficient documentation

## 2018-11-10 DIAGNOSIS — C50412 Malignant neoplasm of upper-outer quadrant of left female breast: Secondary | ICD-10-CM

## 2018-11-10 DIAGNOSIS — Z170 Estrogen receptor positive status [ER+]: Secondary | ICD-10-CM

## 2018-11-10 HISTORY — PX: BREAST LUMPECTOMY WITH RADIOACTIVE SEED AND SENTINEL LYMPH NODE BIOPSY: SHX6550

## 2018-11-10 SURGERY — BREAST LUMPECTOMY WITH RADIOACTIVE SEED AND SENTINEL LYMPH NODE BIOPSY
Anesthesia: General | Site: Breast | Laterality: Left

## 2018-11-10 MED ORDER — CEFAZOLIN SODIUM-DEXTROSE 2-4 GM/100ML-% IV SOLN
INTRAVENOUS | Status: AC
  Filled 2018-11-10: qty 100

## 2018-11-10 MED ORDER — STERILE WATER FOR IRRIGATION IR SOLN
Status: DC | PRN
  Administered 2018-11-10: 1000 mL

## 2018-11-10 MED ORDER — METHYLENE BLUE 0.5 % INJ SOLN
INTRAVENOUS | Status: AC
  Filled 2018-11-10: qty 10

## 2018-11-10 MED ORDER — LIDOCAINE 2% (20 MG/ML) 5 ML SYRINGE
INTRAMUSCULAR | Status: DC | PRN
  Administered 2018-11-10: 60 mg via INTRAVENOUS

## 2018-11-10 MED ORDER — BUPIVACAINE-EPINEPHRINE (PF) 0.25% -1:200000 IJ SOLN
INTRAMUSCULAR | Status: AC
  Filled 2018-11-10: qty 20

## 2018-11-10 MED ORDER — PROPOFOL 10 MG/ML IV BOLUS
INTRAVENOUS | Status: AC
  Filled 2018-11-10: qty 20

## 2018-11-10 MED ORDER — FENTANYL CITRATE (PF) 250 MCG/5ML IJ SOLN
INTRAMUSCULAR | Status: AC
  Filled 2018-11-10: qty 5

## 2018-11-10 MED ORDER — TECHNETIUM TC 99M SULFUR COLLOID FILTERED
1.0000 | Freq: Once | INTRAVENOUS | Status: AC | PRN
  Administered 2018-11-10: 1 via INTRADERMAL

## 2018-11-10 MED ORDER — MIDAZOLAM HCL 2 MG/2ML IJ SOLN
INTRAMUSCULAR | Status: AC
  Filled 2018-11-10: qty 2

## 2018-11-10 MED ORDER — DEXAMETHASONE SODIUM PHOSPHATE 10 MG/ML IJ SOLN
INTRAMUSCULAR | Status: DC | PRN
  Administered 2018-11-10: 5 mg via INTRAVENOUS

## 2018-11-10 MED ORDER — ENSURE PRE-SURGERY PO LIQD: 296.0000 mL | Freq: Once | ORAL | Status: DC

## 2018-11-10 MED ORDER — FENTANYL CITRATE (PF) 100 MCG/2ML IJ SOLN: 100.0000 ug | Freq: Once | INTRAMUSCULAR | Status: DC

## 2018-11-10 MED ORDER — MIDAZOLAM HCL 2 MG/2ML IJ SOLN: 2.0000 mg | Freq: Once | INTRAMUSCULAR | Status: DC

## 2018-11-10 MED ORDER — GABAPENTIN 100 MG PO CAPS: 100.0000 mg | ORAL_CAPSULE | ORAL | Status: DC

## 2018-11-10 MED ORDER — OXYCODONE HCL 5 MG PO TABS
ORAL_TABLET | ORAL | Status: AC
  Filled 2018-11-10: qty 1

## 2018-11-10 MED ORDER — OXYCODONE HCL 5 MG PO TABS: 5.0000 mg | ORAL_TABLET | Freq: Four times a day (QID) | ORAL | 0 refills | Status: DC | PRN

## 2018-11-10 MED ORDER — EPHEDRINE SULFATE-NACL 50-0.9 MG/10ML-% IV SOSY
PREFILLED_SYRINGE | INTRAVENOUS | Status: DC | PRN
  Administered 2018-11-10: 10 mg via INTRAVENOUS

## 2018-11-10 MED ORDER — OXYCODONE HCL 5 MG/5ML PO SOLN: 5.0000 mg | Freq: Once | ORAL | Status: AC | PRN

## 2018-11-10 MED ORDER — CEFAZOLIN SODIUM-DEXTROSE 2-4 GM/100ML-% IV SOLN
2.0000 g | INTRAVENOUS | Status: AC
  Administered 2018-11-10: 15:00:00 2 g via INTRAVENOUS

## 2018-11-10 MED ORDER — ACETAMINOPHEN 500 MG PO TABS
1000.0000 mg | ORAL_TABLET | ORAL | Status: AC
  Administered 2018-11-10: 1000 mg via ORAL

## 2018-11-10 MED ORDER — PROMETHAZINE HCL 25 MG/ML IJ SOLN: 6.2500 mg | INTRAMUSCULAR | Status: DC | PRN

## 2018-11-10 MED ORDER — KETOROLAC TROMETHAMINE 15 MG/ML IJ SOLN
INTRAMUSCULAR | Status: AC
  Filled 2018-11-10: qty 1

## 2018-11-10 MED ORDER — GABAPENTIN 100 MG PO CAPS
ORAL_CAPSULE | ORAL | Status: AC
  Administered 2018-11-10: 13:00:00 100 mg
  Filled 2018-11-10: qty 1

## 2018-11-10 MED ORDER — MEPERIDINE HCL 25 MG/ML IJ SOLN: 6.2500 mg | INTRAMUSCULAR | Status: DC | PRN

## 2018-11-10 MED ORDER — HEMOSTATIC AGENTS (NO CHARGE) OPTIME
TOPICAL | Status: DC | PRN
  Administered 2018-11-10: 1 via TOPICAL

## 2018-11-10 MED ORDER — 0.9 % SODIUM CHLORIDE (POUR BTL) OPTIME
TOPICAL | Status: DC | PRN
  Administered 2018-11-10: 15:00:00 1000 mL

## 2018-11-10 MED ORDER — PROPOFOL 10 MG/ML IV BOLUS
INTRAVENOUS | Status: DC | PRN
  Administered 2018-11-10: 200 mg via INTRAVENOUS

## 2018-11-10 MED ORDER — SODIUM CHLORIDE (PF) 0.9 % IJ SOLN
INTRAMUSCULAR | Status: AC
  Filled 2018-11-10: qty 10

## 2018-11-10 MED ORDER — FENTANYL CITRATE (PF) 100 MCG/2ML IJ SOLN
INTRAMUSCULAR | Status: DC | PRN
  Administered 2018-11-10: 50 ug via INTRAVENOUS

## 2018-11-10 MED ORDER — ONDANSETRON HCL 4 MG/2ML IJ SOLN
INTRAMUSCULAR | Status: DC | PRN
  Administered 2018-11-10: 4 mg via INTRAVENOUS

## 2018-11-10 MED ORDER — ROPIVACAINE HCL 5 MG/ML IJ SOLN
INTRAMUSCULAR | Status: DC | PRN
  Administered 2018-11-10: 30 mL via PERINEURAL

## 2018-11-10 MED ORDER — KETOROLAC TROMETHAMINE 15 MG/ML IJ SOLN: 15.0000 mg | INTRAMUSCULAR | Status: DC

## 2018-11-10 MED ORDER — HYDROMORPHONE HCL 1 MG/ML IJ SOLN: 0.2500 mg | INTRAMUSCULAR | Status: DC | PRN

## 2018-11-10 MED ORDER — LACTATED RINGERS IV SOLN
INTRAVENOUS | Status: DC
  Administered 2018-11-10: 13:00:00 via INTRAVENOUS

## 2018-11-10 MED ORDER — OXYCODONE HCL 5 MG PO TABS
5.0000 mg | ORAL_TABLET | Freq: Once | ORAL | Status: AC | PRN
  Administered 2018-11-10: 5 mg via ORAL

## 2018-11-10 MED ORDER — ACETAMINOPHEN 500 MG PO TABS
ORAL_TABLET | ORAL | Status: AC
  Filled 2018-11-10: qty 2

## 2018-11-10 MED ORDER — FENTANYL CITRATE (PF) 100 MCG/2ML IJ SOLN
INTRAMUSCULAR | Status: AC
  Filled 2018-11-10: qty 2

## 2018-11-10 MED ORDER — BUPIVACAINE-EPINEPHRINE 0.25% -1:200000 IJ SOLN
INTRAMUSCULAR | Status: DC | PRN
  Administered 2018-11-10: 2 mL

## 2018-11-10 SURGICAL SUPPLY — 51 items
APPLIER CLIP 9.375 MED OPEN (MISCELLANEOUS)
BINDER BREAST LRG (GAUZE/BANDAGES/DRESSINGS) IMPLANT
BINDER BREAST XLRG (GAUZE/BANDAGES/DRESSINGS) ×2 IMPLANT
CANISTER SUCT 3000ML PPV (MISCELLANEOUS) ×3 IMPLANT
CHLORAPREP W/TINT 26 (MISCELLANEOUS) ×3 IMPLANT
CLIP APPLIE 9.375 MED OPEN (MISCELLANEOUS) IMPLANT
CLIP VESOCCLUDE MED 6/CT (CLIP) ×3 IMPLANT
CLOSURE STERI-STRIP 1/4X4 (GAUZE/BANDAGES/DRESSINGS) ×2 IMPLANT
CLOSURE WOUND 1/2 X4 (GAUZE/BANDAGES/DRESSINGS) ×1
COVER PROBE W GEL 5X96 (DRAPES) ×3 IMPLANT
COVER SURGICAL LIGHT HANDLE (MISCELLANEOUS) ×3 IMPLANT
COVER WAND RF STERILE (DRAPES) ×3 IMPLANT
DERMABOND ADVANCED (GAUZE/BANDAGES/DRESSINGS) ×2
DERMABOND ADVANCED .7 DNX12 (GAUZE/BANDAGES/DRESSINGS) ×1 IMPLANT
DEVICE DUBIN SPECIMEN MAMMOGRA (MISCELLANEOUS) ×3 IMPLANT
DRAPE CHEST BREAST 15X10 FENES (DRAPES) ×3 IMPLANT
ELECT COATED BLADE 2.86 ST (ELECTRODE) ×3 IMPLANT
ELECT REM PT RETURN 9FT ADLT (ELECTROSURGICAL) ×3
ELECTRODE REM PT RTRN 9FT ADLT (ELECTROSURGICAL) ×1 IMPLANT
GLOVE BIO SURGEON STRL SZ7 (GLOVE) ×3 IMPLANT
GLOVE BIOGEL PI IND STRL 6 (GLOVE) IMPLANT
GLOVE BIOGEL PI IND STRL 7.5 (GLOVE) ×1 IMPLANT
GLOVE BIOGEL PI INDICATOR 6 (GLOVE) ×2
GLOVE BIOGEL PI INDICATOR 7.5 (GLOVE) ×2
GLOVE ECLIPSE 7.0 STRL STRAW (GLOVE) ×2 IMPLANT
GOWN STRL REUS W/ TWL LRG LVL3 (GOWN DISPOSABLE) ×2 IMPLANT
GOWN STRL REUS W/TWL LRG LVL3 (GOWN DISPOSABLE) ×4
ILLUMINATOR WAVEGUIDE N/F (MISCELLANEOUS) IMPLANT
KIT BASIN OR (CUSTOM PROCEDURE TRAY) ×3 IMPLANT
KIT MARKER MARGIN INK (KITS) ×3 IMPLANT
NDL 18GX1X1/2 (RX/OR ONLY) (NEEDLE) IMPLANT
NDL FILTER BLUNT 18X1 1/2 (NEEDLE) IMPLANT
NDL HYPO 25GX1X1/2 BEV (NEEDLE) ×1 IMPLANT
NEEDLE 18GX1X1/2 (RX/OR ONLY) (NEEDLE) IMPLANT
NEEDLE FILTER BLUNT 18X 1/2SAF (NEEDLE)
NEEDLE FILTER BLUNT 18X1 1/2 (NEEDLE) IMPLANT
NEEDLE HYPO 25GX1X1/2 BEV (NEEDLE) ×3 IMPLANT
NS IRRIG 1000ML POUR BTL (IV SOLUTION) ×3 IMPLANT
PACK GENERAL/GYN (CUSTOM PROCEDURE TRAY) ×3 IMPLANT
STRIP CLOSURE SKIN 1/2X4 (GAUZE/BANDAGES/DRESSINGS) ×2 IMPLANT
SUT MNCRL AB 4-0 PS2 18 (SUTURE) ×6 IMPLANT
SUT MON AB 5-0 PS2 18 (SUTURE) IMPLANT
SUT SILK 2 0 SH (SUTURE) ×2 IMPLANT
SUT VIC AB 2-0 SH 27 (SUTURE) ×6
SUT VIC AB 2-0 SH 27X BRD (SUTURE) IMPLANT
SUT VIC AB 2-0 SH 27XBRD (SUTURE) ×2 IMPLANT
SUT VIC AB 3-0 SH 27 (SUTURE) ×2
SUT VIC AB 3-0 SH 27X BRD (SUTURE) ×2 IMPLANT
SYR CONTROL 10ML LL (SYRINGE) ×3 IMPLANT
TOWEL GREEN STERILE (TOWEL DISPOSABLE) ×3 IMPLANT
TOWEL GREEN STERILE FF (TOWEL DISPOSABLE) ×3 IMPLANT

## 2018-11-10 NOTE — Interval H&P Note (Signed)
History and Physical Interval Note:  11/10/2018 1:57 PM  Courtney Williams  has presented today for surgery, with the diagnosis of LEFT BREAST CANCER.  The various methods of treatment have been discussed with the patient and family. After consideration of risks, benefits and other options for treatment, the patient has consented to  Procedure(s): LEFT BREAST LUMPECTOMY WITH RADIOACTIVE SEED AND LEFT AXILLARY SENTINEL LYMPH NODE BIOPSY (Left) as a surgical intervention.  The patient's history has been reviewed, patient examined, no change in status, stable for surgery.  I have reviewed the patient's chart and labs.  Questions were answered to the patient's satisfaction.     Rolm Bookbinder

## 2018-11-10 NOTE — Transfer of Care (Signed)
Immediate Anesthesia Transfer of Care Note  Patient: Courtney Williams  Procedure(s) Performed: LEFT BREAST LUMPECTOMY WITH RADIOACTIVE SEED AND LEFT AXILLARY SENTINEL LYMPH NODE BIOPSY (Left Breast)  Patient Location: PACU  Anesthesia Type:General  Level of Consciousness: awake, alert  and oriented  Airway & Oxygen Therapy: Patient Spontanous Breathing  Post-op Assessment: Report given to RN and Post -op Vital signs reviewed and stable  Post vital signs: Reviewed and stable  Last Vitals:  Vitals Value Taken Time  BP 109/69 11/10/18 1542  Temp    Pulse 82 11/10/18 1546  Resp 15 11/10/18 1546  SpO2 99 % 11/10/18 1546  Vitals shown include unvalidated device data.  Last Pain:  Vitals:   11/10/18 1252  TempSrc: Oral      Patients Stated Pain Goal: 3 ([id number])  Complications: No apparent anesthesia complications

## 2018-11-10 NOTE — H&P (Signed)
48 yof referred by Dr. Autumn Patty for new left breast cancer. Courtney Williams has no prior breast history. Courtney Williams stopped smoking yesterday. Courtney Williams does not know all fh but does not know of any breast or ovarian cancer. Courtney Williams had screening mm. Courtney Williams had no mass or dc. Courtney Williams has c density breasts. Courtney Williams has luoq mass on mm that measures 1.4x1x1.3 cm on mm. there is no axillary adenopathy. this had core biopsy and is grade II IDC that is er/pr pos, her 2 negative and Ki is 15%. her wife Cloyd Stagers is on video chat today.   Past Surgical History Tawni Pummel, RN; 10/27/2018 7:25 AM) No pertinent past surgical history   Diagnostic Studies History Tawni Pummel, RN; 10/27/2018 7:25 AM) Colonoscopy  never Mammogram  within last year Pap Smear  1-5 years ago  Medication History Tawni Pummel, RN; 10/27/2018 7:25 AM) Medications Reconciled  Social History Tawni Pummel, RN; 10/27/2018 7:25 AM) Alcohol use  Moderate alcohol use. Illicit drug use  Uses weekly. Tobacco use  Current every day smoker.  Family History Tawni Pummel, RN; 10/27/2018 7:25 AM) Cancer  Brother, Father. Diabetes Mellitus  Brother, Father.  Pregnancy / Birth History Tawni Pummel, RN; 10/27/2018 7:25 AM) Age at menarche  7 years. Contraceptive History  Oral contraceptives. Gravida  2 Maternal age  48-20 Para  1 Regular periods   Other Problems Tawni Pummel, RN; 10/27/2018 7:25 AM) Breast Cancer  Thyroid Disease   Review of Systems Sunday Spillers Ledford RN; 10/27/2018 7:25 AM) General Not Present- Appetite Loss, Chills, Fatigue, Fever, Night Sweats, Weight Gain and Weight Loss. Skin Not Present- Change in Wart/Mole, Dryness, Hives, Jaundice, New Lesions, Non-Healing Wounds, Rash and Ulcer. HEENT Present- Wears glasses/contact lenses. Not Present- Earache, Hearing Loss, Hoarseness, Nose Bleed, Oral Ulcers, Ringing in the Ears, Seasonal Allergies, Sinus Pain, Sore Throat, Visual Disturbances and Yellow Eyes. Breast  Present- Breast Mass. Not Present- Breast Pain, Nipple Discharge and Skin Changes. Cardiovascular Not Present- Chest Pain, Difficulty Breathing Lying Down, Leg Cramps, Palpitations, Rapid Heart Rate, Shortness of Breath and Swelling of Extremities. Gastrointestinal Not Present- Abdominal Pain, Bloating, Bloody Stool, Change in Bowel Habits, Chronic diarrhea, Constipation, Difficulty Swallowing, Excessive gas, Gets full quickly at meals, Hemorrhoids, Indigestion, Nausea, Rectal Pain and Vomiting. Female Genitourinary Not Present- Frequency, Nocturia, Painful Urination, Pelvic Pain and Urgency. Musculoskeletal Not Present- Back Pain, Joint Pain, Joint Stiffness, Muscle Pain, Muscle Weakness and Swelling of Extremities. Neurological Not Present- Decreased Memory, Fainting, Headaches, Numbness, Seizures, Tingling, Tremor, Trouble walking and Weakness. Psychiatric Not Present- Anxiety, Bipolar, Change in Sleep Pattern, Depression, Fearful and Frequent crying. Endocrine Not Present- Cold Intolerance, Excessive Hunger, Hair Changes, Heat Intolerance, Hot flashes and New Diabetes. Hematology Present- Easy Bruising. Not Present- Blood Thinners, Excessive bleeding, Gland problems, HIV and Persistent Infections.   Physical Exam Rolm Bookbinder MD; 10/27/2018 1:39 PM) General Mental Status-Alert. Orientation-Oriented X3. Head and Neck Trachea-midline. Eye Sclera/Conjunctiva - Bilateral-No scleral icterus. Chest and Lung Exam Chest and lung exam reveals -quiet, even and easy respiratory effort with no use of accessory muscles. Breast Nipples-No Discharge. Breast Lump-No Palpable Breast Mass. Cardiovascular Cardiovascular examination reveals -normal heart sounds, regular rate and rhythm with no murmurs. Abdomen Note: soft no hm Neurologic Neurologic evaluation reveals -alert and oriented x 3 with no impairment of recent or remote memory. Lymphatic Head & Neck General Head &  Neck Lymphatics: Bilateral - Description - Normal. Axillary General Axillary Region: Bilateral - Description - Normal. Note: no [place] adenopathy   Assessment & Plan Rolm Bookbinder MD; 10/27/2018  1:40 PM) BREAST CANCER OF UPPER-OUTER QUADRANT OF LEFT FEMALE BREAST (C50.412) Story: left breast seed guided lumpectomy, left axillary sn biopsy We discussed the staging and pathophysiology of breast cancer. We discussed all of the different options for treatment for breast cancer including surgery, chemotherapy, radiation therapy, Herceptin, and antiestrogen therapy. We discussed a sentinel lymph node biopsy as Courtney Williams does not appear to having lymph node involvement right now. We discussed the performance of that with injection of radioactive tracer. We discussed that there is a chance of having a positive node with a sentinel lymph node biopsy and we will await the permanent pathology to make any other first further decisions in terms of her treatment. We discussed up to a 5% risk lifetime of chronic shoulder pain as well as lymphedema associated with a sentinel lymph node biopsy. We discussed the options for treatment of the breast cancer which included lumpectomy versus a mastectomy. We discussed the performance of the lumpectomy with radioactive seed placement. We discussed a 5-10% chance of a positive margin requiring reexcision in the operating room. We also discussed that Courtney Williams will likely need radiation therapy if Courtney Williams undergoes lumpectomy. The breast cannot undergo more radiation therapy in the same breast after lumpectomy in the future. We discussed mastectomy and the postoperative care for that as well. Mastectomy can be followed by reconstruction. The decision for lumpectomy vs mastectomy has no impact on decision for chemotherapy. Most mastectomy patients will not need radiation therapy. We discussed that there is no difference in her survival whether Courtney Williams undergoes lumpectomy with radiation therapy or  antiestrogen therapy versus a mastectomy. There is also no real difference between her recurrence in the breast. We discussed the risks of operation including bleeding, infection, possible reoperation. Courtney Williams understands her further therapy will be based on what her stages at the time of her operation.

## 2018-11-10 NOTE — Anesthesia Procedure Notes (Signed)
Anesthesia Regional Block: Pectoralis block   Pre-Anesthetic Checklist: ,, timeout performed, Correct Patient, Correct Site, Correct Laterality, Correct Procedure, Correct Position, site marked, Risks and benefits discussed,  Surgical consent,  Pre-op evaluation,  At surgeon's request and post-op pain management  Laterality: Left  Prep: chloraprep       Needles:  Injection technique: Single-shot  Needle Type: Stimiplex     Needle Length: 9cm  Needle Gauge: 21     Additional Needles:   Procedures:,,,, ultrasound used (permanent image in chart),,,,  Narrative:  Start time: 11/10/2018 2:24 PM End time: 11/10/2018 2:29 PM Injection made incrementally with aspirations every 5 mL.  Performed by: Personally  Anesthesiologist: Lynda Rainwater, MD

## 2018-11-10 NOTE — Anesthesia Procedure Notes (Signed)
Procedure Name: LMA Insertion Date/Time: 11/10/2018 2:34 PM Performed by: Imagene Riches, CRNA Pre-anesthesia Checklist: Patient identified, Emergency Drugs available, Suction available and Patient being monitored Patient Re-evaluated:Patient Re-evaluated prior to induction Oxygen Delivery Method: Circle System Utilized Preoxygenation: Pre-oxygenation with 100% oxygen Induction Type: IV induction Ventilation: Mask ventilation without difficulty LMA: LMA inserted LMA Size: 4.0 Number of attempts: 1 Airway Equipment and Method: Bite block Placement Confirmation: positive ETCO2 Tube secured with: Tape Dental Injury: Teeth and Oropharynx as per pre-operative assessment

## 2018-11-10 NOTE — Op Note (Signed)
Preoperative diagnosis:clinical stage I left breast cancer Postoperative diagnosis: Same as above Procedure: Leftbreastseed guided lumpectomy Left deep axillary sentinel node biopsy Surgeon: Dr. Serita Grammes Anesthesia: General  Specimens: 1. Left breast tissue marked with paint, containing seed and clip 2. Additional left breast superior, medial and inferior margins marked short superior, long lateral, double deep 3. Left deep axillary nodes Estimated blood [id number] cc Complications: None Drains: None Sponge and count was correct at completion Disposition to recovery in stable condition  Indications: This is a25 yof who has a clinical stage I left breast cancer found by mammogram. We discussed options and have elected to proceed with lumpectomy/sn biopsy. The radioactive seed was placed prior to beginning.   Procedure: After informed consent was obtained shewas placed under general anesthesia without complication. She was given antibiotics. SCDs were in place. She was then prepped and draped in the standard sterile surgical fashion. A surgical timeout was then performed.  I then identified the seed in thelateral portion of the left breast. I made a curvilinear incision in the uoq near the low axilla as I did both procedures through this incision. Iinfiltrated Marcaine.I then tunneled to the seed using the neoprobe for guidance. I then removed the seed and the surrounding tissue with an attempt to get a clear margin. The seed and the clip were both on the specimen mammogram. I thought the seed was close in a couple margins so I removed some more. the posterior margin was the pectoralis.  I then located the sentinel node in the low axilla.I entered the axillary fascia. I then identified the sentinel node with a count of 179. I excised this. The background radioactivity was less than 10% and there were no palpable nodes present. I then obtained hemostasis. I closed the  fascia with 2-0 vicryl I mobilized the breast tissue and closed this with 2-0 Vicryl, 3-0 Vicryl, and5-0 Monocryl.glue and steristrips were applied. She had a binder placed. She was extubated and transferred to recovery stable

## 2018-11-10 NOTE — Discharge Instructions (Signed)
[facility] °Office Phone Number [phone] ° °POST OP INSTRUCTIONS °Take 400 mg of ibuprofen every 8 hours or 650 mg tylenol every 6 hours for next 72 hours then as needed. Use ice several times daily also. °Always review your discharge instruction sheet given to you by the facility where your surgery was performed. ° °IF YOU HAVE DISABILITY OR FAMILY LEAVE FORMS, YOU MUST BRING THEM TO THE OFFICE FOR PROCESSING.  DO NOT GIVE THEM TO YOUR DOCTOR. ° °1. A prescription for pain medication may be given to you upon discharge.  Take your pain medication as prescribed, if needed.  If narcotic pain medicine is not needed, then you may take acetaminophen (Tylenol), naprosyn (Alleve) or ibuprofen (Advil) as needed. °2. Take your usually prescribed medications unless otherwise directed °3. If you need a refill on your pain medication, please contact your pharmacy.  They will contact our office to request authorization.  Prescriptions will not be filled after 5pm or on week-ends. °4. You should eat very light the first 24 hours after surgery, such as soup, crackers, pudding, etc.  Resume your normal diet the day after surgery. °5. Most patients will experience some swelling and bruising in the breast.  Ice packs and a good support bra will help.  Wear the breast binder provided or a sports bra for 72 hours day and night.  After that wear a sports bra during the day until you return to the office. Swelling and bruising can take several days to resolve.  °6. It is common to experience some constipation if taking pain medication after surgery.  Increasing fluid intake and taking a stool softener will usually help or prevent this problem from occurring.  A mild laxative (Milk of Magnesia or Miralax) should be taken according to package directions if there are no bowel movements after 48 hours. °7. Unless discharge instructions indicate otherwise, you may remove your bandages 48 hours after surgery and you may  shower at that time.  You may have steri-strips (small skin tapes) in place directly over the incision.  These strips should be left on the skin for 7-10 days and will come off on their own.  If your surgeon used skin glue on the incision, you may shower in 24 hours.  The glue will flake off over the next 2-3 weeks.  Any sutures or staples will be removed at the office during your follow-up visit. °8. ACTIVITIES:  You may resume regular daily activities (gradually increasing) beginning the next day.  Wearing a good support bra or sports bra minimizes pain and swelling.  You may have sexual intercourse when it is comfortable. °a. You may drive when you no longer are taking prescription pain medication, you can comfortably wear a seatbelt, and you can safely maneuver your car and apply brakes. °b. RETURN TO WORK:  ______________________________________________________________________________________ °9. You should see your doctor in the office for a follow-up appointment approximately two weeks after your surgery.  Your doctor’s nurse will typically make your follow-up appointment when she calls you with your pathology report.  Expect your pathology report 3-4 business days after your surgery.  You may call to check if you do not hear from us after three days. °10. OTHER INSTRUCTIONS: _______________________________________________________________________________________________ _____________________________________________________________________________________________________________________________________ °_____________________________________________________________________________________________________________________________________ °_____________________________________________________________________________________________________________________________________ ° °WHEN TO CALL DR Keywon Mestre: °1. Fever over 101.0 °2. Nausea and/or vomiting. °3. Extreme swelling or bruising. °4. Continued bleeding from  incision. °5. Increased pain, redness, or drainage from the incision. ° °The clinic staff is available to   answer your questions during regular business hours.  Please don’t hesitate to call and ask to speak to one of the nurses for clinical concerns.  If you have a medical emergency, go to the nearest emergency room or call 911.  A surgeon from [facility] is always on call at the hospital. ° °For further questions, please visit [email or web address] ° °

## 2018-11-11 ENCOUNTER — Encounter (HOSPITAL_COMMUNITY): Payer: Self-pay | Admitting: General Surgery

## 2018-11-11 NOTE — Anesthesia Postprocedure Evaluation (Signed)
Anesthesia Post Note  Patient: Courtney Williams  Procedure(s) Performed: LEFT BREAST LUMPECTOMY WITH RADIOACTIVE SEED AND LEFT AXILLARY SENTINEL LYMPH NODE BIOPSY (Left Breast)     Patient location during evaluation: PACU Anesthesia Type: General Level of consciousness: awake and alert Pain management: pain level controlled Vital Signs Assessment: post-procedure vital signs reviewed and stable Respiratory status: spontaneous breathing, nonlabored ventilation and respiratory function stable Cardiovascular status: blood pressure returned to baseline and stable Postop Assessment: no apparent nausea or vomiting Anesthetic complications: no    Last Vitals:  Vitals:   11/10/18 1557 11/10/18 1612  BP: 110/73 110/73  Pulse: 79 70  Resp: 18 16  Temp:  36.7 C  SpO2: 99% 98%    Last Pain:  Vitals:   11/10/18 1632  TempSrc:   PainSc: [place]

## 2018-11-16 ENCOUNTER — Telehealth: Payer: Self-pay

## 2018-11-16 LAB — SURGICAL PATHOLOGY

## 2018-11-16 NOTE — Telephone Encounter (Signed)
Nutrition Assessment  Reason for Assessment:  Pt attended Breast Clinic on 10/7 and was given nutrition packet by nurse navigator  ASSESSMENT:  48 year old female with left breast cancer.  Patient s/p left lumpectomy, planning oncotype, adjuvant radiation and anitestrogens.  Past medical history reviewed.  Spoke with patient via phone to introduce self and service at [facility]  Patient reports that she has a good appetite.  Denies nutrition questions or concerns at this time  Medications:  reviewed  Labs: reviewed  Anthropometrics:   Height: 61.75 inches Weight: 164 lb BMI: 30   NUTRITION DIAGNOSIS: Food and nutrition related knowledge deficit related to new diagnosis of breast cancer as evidenced by no prior need for nutrition related information.  INTERVENTION:   Discussed briefly packet of information regarding nutritional tips for breast cancer patients.  No questions at this time.  Contact information provided and patient knows to contact me with questions/concerns.    MONITORING, EVALUATION, and GOAL: Pt will consume a healthy plant based diet to maintain lean body mass throughout treatment.   Courtney Williams, [place], [place] Registered Dietitian [phone] (pager)

## 2018-11-17 ENCOUNTER — Telehealth: Payer: Self-pay | Admitting: *Deleted

## 2018-11-17 NOTE — Progress Notes (Signed)
Patient Care Team: Patient, [name] as PCP - General (General Practice) Rockwell Germany, RN as Oncology Nurse Navigator Mauro Kaufmann, RN as Oncology Nurse Navigator Rolm Bookbinder, MD as Consulting Physician (General Surgery) Nicholas Lose, MD as Consulting Physician (Hematology and Oncology)  DIAGNOSIS:    ICD-10-CM   1. Malignant neoplasm of upper-outer quadrant of left breast in female, estrogen receptor positive ([place])  C50.412    Z17.0     SUMMARY OF ONCOLOGIC HISTORY: Oncology History  Malignant neoplasm of upper-outer quadrant of left breast in female, estrogen receptor positive ([place])  10/22/2018 Initial Diagnosis   Routine screening mammogram showed a 1.4cm mass in the left breast at the 2:00 position with no left axillary adenopathy. Biopsy showed IDC, grade 2, HER-2 equivocal (2+), ER+ 80%, PR+ 10%, Ki67 15%.   10/27/2018 Cancer Staging   Staging form: Breast, AJCC 8th Edition - Clinical stage from 10/27/2018: Stage IA (cT1c, cN0, cM0, G2, ER+, PR+, HER2-) - Signed by Nicholas Lose, MD on 10/27/2018   11/05/2018 Genetic Testing   Negative genetic testing: no pathogenic variants detected on the [facility] Breast Cancer STAT panel and Common Hereditary Cancers Panel. The report date is 11/05/2018.  The Breast Cancer STAT panel offered by [facility] includes sequencing and rearrangement analysis for the following 9 genes:  ATM, BRCA1, BRCA2, CDH1, CHEK2, PALB2, PTEN, STK11 and TP53.  The Common Hereditary Gene Panel offered by [facility] includes sequencing and/or deletion duplication testing of the following 48 genes: APC, ATM, AXIN2, BARD1, BMPR1A, BRCA1, BRCA2, BRIP1, CDH1, CDK4, CDKN2A (p14ARF), CDKN2A (p16INK4a), CHEK2, CTNNA1, DICER1, EPCAM (Deletion/duplication testing only), GREM1 (promoter region deletion/duplication testing only), KIT, MEN1, MLH1, MSH2, MSH3, MSH6, MUTYH, NBN, NF1, NHTL1, PALB2, PDGFRA, PMS2, POLD1, POLE, PTEN, RAD50, RAD51C, RAD51D, RNF43, SDHB, SDHC,  SDHD, SMAD4, SMARCA4. STK11, TP53, TSC1, TSC2, and VHL.  The following genes were evaluated for sequence changes only: SDHA and HOXB13 c.251G>A variant only.   11/10/2018 Surgery   Left lumpectomy [name]): IDC, grade 2, 1.4cm, clear margins, and micrometastasis in 1 left axillary lymph node.     CHIEF COMPLIANT: Follow-up s/p lumpectomy to review pathology report  INTERVAL HISTORY: Courtney Williams is a 48 y.o. with above-mentioned history of left breast cancer. She underwent a left lumpectomy on 11/10/18 with Dr. [name] for which pathology confirmed invasive ductal carcinoma, grade 2, 1.4cm, clear margins, and micrometastasis in 1 left axillary lymph node. She presents to the clinic today to review the pathology report and discuss further treatment.   REVIEW OF SYSTEMS:   Constitutional: Denies fevers, chills or abnormal weight loss Eyes: Denies blurriness of vision Ears, nose, mouth, throat, and face: Denies mucositis or sore throat Respiratory: Denies cough, dyspnea or wheezes Cardiovascular: Denies palpitation, chest discomfort Gastrointestinal: Denies nausea, heartburn or change in bowel habits Skin: Denies abnormal skin rashes Lymphatics: Denies new lymphadenopathy or easy bruising Neurological: Denies numbness, tingling or new weaknesses Behavioral/Psych: Mood is stable, no new changes  Extremities: No lower extremity edema Breast: denies any pain or lumps or nodules in either breasts All other systems were reviewed with the patient and are negative.  I have reviewed the past medical history, past surgical history, social history and family history with the patient and they are unchanged from previous note.  ALLERGIES:  has No Known Allergies.  MEDICATIONS:  Current Outpatient Medications  Medication Sig Dispense Refill   fexofenadine (ALLEGRA) 180 MG tablet Take 180 mg by mouth daily as needed for allergies.  levothyroxine (SYNTHROID) 125 MCG tablet TAKE 1 TABLET (125  MCG TOTAL) BY MOUTH DAILY BEFORE BREAKFAST. 30 tablet 12   oxyCODONE (OXY IR/ROXICODONE) 5 MG immediate release tablet Take 1 tablet (5 mg total) by mouth every 6 (six) hours as needed for moderate pain, severe pain or breakthrough pain. 10 tablet 0   No current facility-administered medications for this visit.     PHYSICAL EXAMINATION: ECOG PERFORMANCE STATUS: 1 - Symptomatic but completely ambulatory  Vitals:   11/18/18 1524  BP: 113/79  Pulse: 69  Resp: 18  Temp: 98.7 F (37.1 C)  SpO2: 100%   Filed Weights   11/18/18 1524  Weight: 170 lb 4.8 oz (77.2 kg)    GENERAL: alert, no distress and comfortable SKIN: skin color, texture, turgor are normal, no rashes or significant lesions EYES: normal, Conjunctiva are pink and non-injected, sclera clear OROPHARYNX: no exudate, no erythema and lips, buccal mucosa, and tongue normal  NECK: supple, thyroid normal size, non-tender, without nodularity LYMPH: no palpable lymphadenopathy in the cervical, axillary or inguinal LUNGS: clear to auscultation and percussion with normal breathing effort HEART: regular rate & rhythm and no murmurs and no lower extremity edema ABDOMEN: abdomen soft, non-tender and normal bowel sounds MUSCULOSKELETAL: no cyanosis of digits and no clubbing  NEURO: alert & oriented x 3 with fluent speech, no focal motor/sensory deficits EXTREMITIES: No lower extremity edema  LABORATORY DATA:  I have reviewed the data as listed CMP Latest Ref Rng & Units 11/04/2018 10/27/2018 07/02/2018  Glucose 70 - 99 mg/dL 86 89 102(H)  BUN 6 - 20 mg/dL _0 Creatinine 0.44 - 1.00 mg/dL 0.58 0.77 0.76  Sodium 135 - 145 mmol/L 140 140 141  Potassium 3.5 - 5.1 mmol/L 3.5 3.8 3.7  Chloride 98 - 111 mmol/L 104 104 103  CO2 22 - 32 mmol/L _1 Calcium 8.9 - 10.3 mg/dL 9.0 9.3 9.5  Total Protein 6.5 - 8.1 g/dL 6.6 6.9 6.7  Total Bilirubin 0.3 - 1.2 mg/dL 0.9 0.3 0.4  Alkaline Phos 38 - 126 U/L 42 46 48  AST 15 - 41 U/L 17  13(L) 10  ALT 0 - 44 U/L _2 Lab Results  Component Value Date   WBC 8.9 11/04/2018   HGB 13.8 11/04/2018   HCT 42.9 11/04/2018   MCV 94.7 11/04/2018   PLT 376 11/04/2018   NEUTROABS 4.5 10/27/2018    ASSESSMENT & PLAN:  Malignant neoplasm of upper-outer quadrant of left breast in female, estrogen receptor positive ([place]) 10/22/18: Routine screening mammogram showed a 1.4cm mass in the left breast at the 2:00 position with no left axillary adenopathy. Biopsy showed IDC, grade 2, HER-2 equivocal (2+), ER+ 80%, PR+ 10%, Ki67 15%.  11/10/2018:Left lumpectomy [name]): IDC, grade 2, 1.4cm, clear margins, and micrometastasis in 1 left axillary lymph node.  ER 80%, PR 10%, Ki-67 15%, HER-2 equivocal 2+ by IHC but FISH negative  Pathology counseling: I discussed the final pathology report of the patient provided  a copy of this report. I discussed the margins as well as lymph node surgeries. We also discussed the final staging along with previously performed ER/PR and HER-2/neu testing.  Treatment plan: 1. Oncotype DX testing to determine if chemotherapy would be of any benefit followed by 2. Adjuvant radiation therapy followed by 3. Adjuvant antiestrogen therapy  Return to clinic based upon Oncotype DX test result     No orders of the defined types were placed  in this encounter.  The patient has a good understanding of the overall plan. she agrees with it. she will call with any problems that may develop before the next visit here.  Nicholas Lose, MD 11/18/2018  Julious Oka Dorshimer am acting as scribe for Dr. Nicholas Lose.  I have reviewed the above documentation for accuracy and completeness, and I agree with the above.

## 2018-11-17 NOTE — Telephone Encounter (Signed)
Received order for oncotype testing. Requisition faxed to pathology and [facility] °

## 2018-11-18 ENCOUNTER — Inpatient Hospital Stay (HOSPITAL_BASED_OUTPATIENT_CLINIC_OR_DEPARTMENT_OTHER): Payer: 59 | Admitting: Hematology and Oncology

## 2018-11-18 ENCOUNTER — Other Ambulatory Visit: Payer: Self-pay

## 2018-11-18 ENCOUNTER — Telehealth: Payer: Self-pay | Admitting: *Deleted

## 2018-11-18 DIAGNOSIS — C50412 Malignant neoplasm of upper-outer quadrant of left female breast: Secondary | ICD-10-CM | POA: Diagnosis not present

## 2018-11-18 DIAGNOSIS — Z170 Estrogen receptor positive status [ER+]: Secondary | ICD-10-CM | POA: Diagnosis not present

## 2018-11-18 NOTE — Telephone Encounter (Signed)
Records faxed to [place] - att Carolanne Grumbling - release [id number]

## 2018-11-18 NOTE — Assessment & Plan Note (Signed)
10/22/18: Routine screening mammogram showed a 1.4cm mass in the left breast at the 2:00 position with no left axillary adenopathy. Biopsy showed IDC, grade 2, HER-2 equivocal (2+), ER+ 80%, PR+ 10%, Ki67 15%.  11/10/2018:Left lumpectomy [name]): IDC, grade 2, 1.4cm, clear margins, and micrometastasis in 1 left axillary lymph node.  ER 80%, PR 10%, Ki-67 15%, HER-2 equivocal 2+ by IHC but FISH negative  Pathology counseling: I discussed the final pathology report of the patient provided  a copy of this report. I discussed the margins as well as lymph node surgeries. We also discussed the final staging along with previously performed ER/PR and HER-2/neu testing.  Treatment plan: 1. Oncotype DX testing to determine if chemotherapy would be of any benefit followed by 2. Adjuvant radiation therapy followed by 3. Adjuvant antiestrogen therapy  Return to clinic based upon Oncotype DX test result

## 2018-11-19 ENCOUNTER — Telehealth: Payer: Self-pay | Admitting: Hematology and Oncology

## 2018-11-19 NOTE — Telephone Encounter (Signed)
No 10/29 LOS  

## 2018-11-21 HISTORY — PX: BREAST LUMPECTOMY: SHX2

## 2018-11-24 ENCOUNTER — Ambulatory Visit: Payer: 59 | Admitting: Hematology and Oncology

## 2018-11-29 ENCOUNTER — Telehealth: Payer: Self-pay | Admitting: *Deleted

## 2018-11-29 NOTE — Telephone Encounter (Signed)
Left breast Biopsy 10/22/18: Grade 2 ductal carcinoma in left breast.   11/10/18: LEFT BREAST LUMPECTOMY WITH RADIOACTIVE SEED AND LEFT AXILLARY SENTINEL LYMPH NODE BIOPSY by Dr. [name].   Ok to remove from [facility] hold?   Routing to Dr. Sabra Heck  Cc: Melvia Heaps, CNM

## 2018-11-30 ENCOUNTER — Telehealth: Payer: Self-pay | Admitting: *Deleted

## 2018-11-30 ENCOUNTER — Encounter: Payer: Self-pay | Admitting: Hematology and Oncology

## 2018-11-30 NOTE — Telephone Encounter (Signed)
Received oncotype score of 22/18%. Physician team notified. Called pt with appt to discuss results with Dr. Lindi Adie on 11/13 at 1230.

## 2018-11-30 NOTE — Telephone Encounter (Signed)
Removed from [facility] hold.   Encounter closed.  

## 2018-11-30 NOTE — Telephone Encounter (Signed)
Yes, ok to remove from [facility] hold.  Has diagnosis of invasive ductal breast ca and has appt with Dr. Lindi Adie 12/03/2018.

## 2018-12-01 ENCOUNTER — Encounter (HOSPITAL_COMMUNITY): Payer: Self-pay | Admitting: Hematology and Oncology

## 2018-12-02 ENCOUNTER — Other Ambulatory Visit: Payer: Self-pay

## 2018-12-02 NOTE — Progress Notes (Signed)
Patient Care Team: Patient, [name] as PCP - General (General Practice) Rockwell Germany, RN as Oncology Nurse Navigator Mauro Kaufmann, RN as Oncology Nurse Navigator Rolm Bookbinder, MD as Consulting Physician (General Surgery) Nicholas Lose, MD as Consulting Physician (Hematology and Oncology)  DIAGNOSIS:    ICD-10-CM   1. Malignant neoplasm of upper-outer quadrant of left breast in female, estrogen receptor positive ([place])  C50.412    Z17.0     SUMMARY OF ONCOLOGIC HISTORY: Oncology History  Malignant neoplasm of upper-outer quadrant of left breast in female, estrogen receptor positive ([place])  10/22/2018 Initial Diagnosis   Routine screening mammogram showed a 1.4cm mass in the left breast at the 2:00 position with no left axillary adenopathy. Biopsy showed IDC, grade 2, HER-2 equivocal (2+), ER+ 80%, PR+ 10%, Ki67 15%.   10/27/2018 Cancer Staging   Staging form: Breast, AJCC 8th Edition - Clinical stage from 10/27/2018: Stage IA (cT1c, cN0, cM0, G2, ER+, PR+, HER2-) - Signed by Nicholas Lose, MD on 10/27/2018   11/05/2018 Genetic Testing   Negative genetic testing: no pathogenic variants detected on the [facility] Breast Cancer STAT panel and Common Hereditary Cancers Panel. The report date is 11/05/2018.  The Breast Cancer STAT panel offered by [facility] includes sequencing and rearrangement analysis for the following 9 genes:  ATM, BRCA1, BRCA2, CDH1, CHEK2, PALB2, PTEN, STK11 and TP53.  The Common Hereditary Gene Panel offered by [facility] includes sequencing and/or deletion duplication testing of the following 48 genes: APC, ATM, AXIN2, BARD1, BMPR1A, BRCA1, BRCA2, BRIP1, CDH1, CDK4, CDKN2A (p14ARF), CDKN2A (p16INK4a), CHEK2, CTNNA1, DICER1, EPCAM (Deletion/duplication testing only), GREM1 (promoter region deletion/duplication testing only), KIT, MEN1, MLH1, MSH2, MSH3, MSH6, MUTYH, NBN, NF1, NHTL1, PALB2, PDGFRA, PMS2, POLD1, POLE, PTEN, RAD50, RAD51C, RAD51D, RNF43, SDHB, SDHC,  SDHD, SMAD4, SMARCA4. STK11, TP53, TSC1, TSC2, and VHL.  The following genes were evaluated for sequence changes only: SDHA and HOXB13 c.251G>A variant only.   11/10/2018 Surgery   Left lumpectomy Courtney Williams): IDC, grade 2, 1.4cm, clear margins, and micrometastasis in 1 left axillary lymph node.   11/30/2018 Oncotype testing   Score of 22, 18% chance of distant recurrence in 9 years without systemic treatment.     CHIEF COMPLIANT: Follow-up of left breast cancer to discuss Oncotype results  INTERVAL HISTORY: Courtney Williams is a 48 y.o. with above-mentioned history of left breast cancer who underwent a lumpectomy. Oncotype testing revealed a score of 22 and 18% chance of distant recurrence in 9 years without systemic treatment. She presents to the clinic today to review her Oncotype results and discuss further treatment.  REVIEW OF SYSTEMS:   Constitutional: Denies fevers, chills or abnormal weight loss Eyes: Denies blurriness of vision Ears, nose, mouth, throat, and face: Denies mucositis or sore throat Respiratory: Denies cough, dyspnea or wheezes Cardiovascular: Denies palpitation, chest discomfort Gastrointestinal: Denies nausea, heartburn or change in bowel habits Skin: Denies abnormal skin rashes Lymphatics: Denies new lymphadenopathy or easy bruising Neurological: Denies numbness, tingling or new weaknesses Behavioral/Psych: Mood is stable, no new changes  Extremities: No lower extremity edema Breast: denies any pain or lumps or nodules in either breasts All other systems were reviewed with the patient and are negative.  I have reviewed the past medical history, past surgical history, social history and family history with the patient and they are unchanged from previous note.  ALLERGIES:  has No Known Allergies.  MEDICATIONS:  Current Outpatient Medications  Medication Sig Dispense Refill  . fexofenadine (ALLEGRA) 180 MG  tablet Take 180 mg by mouth daily as needed for  allergies.     Marland Kitchen levothyroxine (SYNTHROID) 125 MCG tablet TAKE 1 TABLET (125 MCG TOTAL) BY MOUTH DAILY BEFORE BREAKFAST. 30 tablet 12  . oxyCODONE (OXY IR/ROXICODONE) 5 MG immediate release tablet Take 1 tablet (5 mg total) by mouth every 6 (six) hours as needed for moderate pain, severe pain or breakthrough pain. 10 tablet 0   No current facility-administered medications for this visit.     PHYSICAL EXAMINATION: ECOG PERFORMANCE STATUS: 1 - Symptomatic but completely ambulatory  Vitals:   12/03/18 1250  BP: 129/84  Pulse: 63  Resp: 17  Temp: 98.2 F (36.8 C)  SpO2: 100%   Filed Weights   12/03/18 1250  Weight: 168 lb 4.8 oz (76.3 kg)    GENERAL: alert, no distress and comfortable SKIN: skin color, texture, turgor are normal, no rashes or significant lesions EYES: normal, Conjunctiva are pink and non-injected, sclera clear OROPHARYNX: no exudate, no erythema and lips, buccal mucosa, and tongue normal  NECK: supple, thyroid normal size, non-tender, without nodularity LYMPH: no palpable lymphadenopathy in the cervical, axillary or inguinal LUNGS: clear to auscultation and percussion with normal breathing effort HEART: regular rate & rhythm and no murmurs and no lower extremity edema ABDOMEN: abdomen soft, non-tender and normal bowel sounds MUSCULOSKELETAL: no cyanosis of digits and no clubbing  NEURO: alert & oriented x 3 with fluent speech, no focal motor/sensory deficits EXTREMITIES: No lower extremity edema  LABORATORY DATA:  I have reviewed the data as listed CMP Latest Ref Rng & Units 11/04/2018 10/27/2018 07/02/2018  Glucose 70 - 99 mg/dL 86 89 102(H)  BUN 6 - 20 mg/dL '9 12 11  '$ Creatinine 0.44 - 1.00 mg/dL 0.58 0.77 0.76  Sodium 135 - 145 mmol/L 140 140 141  Potassium 3.5 - 5.1 mmol/L 3.5 3.8 3.7  Chloride 98 - 111 mmol/L 104 104 103  CO2 22 - 32 mmol/L '27 28 26  '$ Calcium 8.9 - 10.3 mg/dL 9.0 9.3 9.5  Total Protein 6.5 - 8.1 g/dL 6.6 6.9 6.7  Total Bilirubin 0.3 -  1.2 mg/dL 0.9 0.3 0.4  Alkaline Phos 38 - 126 U/L 42 46 48  AST 15 - 41 U/L 17 13(L) 10  ALT 0 - 44 U/L '16 14 13    '$ Lab Results  Component Value Date   WBC 8.9 11/04/2018   HGB 13.8 11/04/2018   HCT 42.9 11/04/2018   MCV 94.7 11/04/2018   PLT 376 11/04/2018   NEUTROABS 4.5 10/27/2018    ASSESSMENT & PLAN:  Malignant neoplasm of upper-outer quadrant of left breast in female, estrogen receptor positive ([place]) 10/22/18:Routine screening mammogram showed a 1.4cm mass in the left breast at the 2:00 position with no left axillary adenopathy. Biopsy showed IDC, grade 2, HER-2 equivocal (2+), ER+ 80%, PR+ 10%, Ki67 15%.  11/10/2018:Left lumpectomy Courtney Williams): IDC, grade 2, 1.4cm, clear margins, and micrometastasis in 1 left axillary lymph node.  ER 80%, PR 10%, Ki-67 15%, HER-2 equivocal 2+ by IHC but FISH negative  Oncotype DX score: 22 distant recurrence at 9 years with hormone therapy alone 18% Recommendation: Based upon tailor Rx data, I recommended systemic chemotherapy with Taxotere and Cytoxan every 3 weeks x4 cycles  Chemotherapy Counseling: I discussed the risks and benefits of chemotherapy including the risks of nausea/ vomiting, risk of infection from low WBC count, fatigue due to chemo or anemia, bruising or bleeding due to low platelets, mouth sores, loss/ change in taste and  decreased appetite. Liver and kidney function will be monitored through out chemotherapy as abnormalities in liver and kidney function may be a side effect of treatment. Risk of permanent bone marrow dysfunction and leukemia due to chemo were also discussed.  Recommended participation SWOG 1417 clinical trial for neuropathy. Patient will think about what I discussed and will inform us of her decision. She may be interested in [place] cap for hair loss prevention.  She was very emotional and tearful through the visit. Once she informs US of the decision then further plans will be made.  She will need a port.     No orders of the defined types were placed in this encounter.  The patient has a good understanding of the overall plan. she agrees with it. she will call with any problems that may develop before the next visit here.  Nicholas Lose, MD 12/03/2018  Julious Oka Dorshimer, am acting as scribe for Dr. Nicholas Lose.  I have reviewed the above documentation for accuracy and completeness, and I agree with the above.

## 2018-12-03 ENCOUNTER — Inpatient Hospital Stay: Payer: 59 | Attending: Hematology and Oncology | Admitting: Hematology and Oncology

## 2018-12-03 ENCOUNTER — Other Ambulatory Visit (HOSPITAL_COMMUNITY)
Admission: RE | Admit: 2018-12-03 | Discharge: 2018-12-03 | Disposition: A | Discharge status: Home / Self Care | Payer: 59 | Source: Ambulatory Visit | Attending: Certified Nurse Midwife | Admitting: Certified Nurse Midwife

## 2018-12-03 ENCOUNTER — Encounter: Payer: Self-pay | Admitting: Certified Nurse Midwife

## 2018-12-03 ENCOUNTER — Ambulatory Visit: Payer: 59 | Admitting: Certified Nurse Midwife

## 2018-12-03 ENCOUNTER — Other Ambulatory Visit: Payer: Self-pay

## 2018-12-03 VITALS — BP 114/68 | HR 68 | Temp 97.2°F | Resp 16 | Wt 167.0 lb

## 2018-12-03 DIAGNOSIS — N939 Abnormal uterine and vaginal bleeding, unspecified: Secondary | ICD-10-CM | POA: Insufficient documentation

## 2018-12-03 DIAGNOSIS — C50412 Malignant neoplasm of upper-outer quadrant of left female breast: Secondary | ICD-10-CM | POA: Diagnosis not present

## 2018-12-03 DIAGNOSIS — G629 Polyneuropathy, unspecified: Secondary | ICD-10-CM | POA: Diagnosis not present

## 2018-12-03 DIAGNOSIS — Z79899 Other long term (current) drug therapy: Secondary | ICD-10-CM | POA: Insufficient documentation

## 2018-12-03 DIAGNOSIS — N912 Amenorrhea, unspecified: Secondary | ICD-10-CM

## 2018-12-03 DIAGNOSIS — N898 Other specified noninflammatory disorders of vagina: Secondary | ICD-10-CM

## 2018-12-03 DIAGNOSIS — Z170 Estrogen receptor positive status [ER+]: Secondary | ICD-10-CM | POA: Diagnosis not present

## 2018-12-03 DIAGNOSIS — E038 Other specified hypothyroidism: Secondary | ICD-10-CM

## 2018-12-03 DIAGNOSIS — Z113 Encounter for screening for infections with a predominantly sexual mode of transmission: Secondary | ICD-10-CM | POA: Diagnosis not present

## 2018-12-03 NOTE — Assessment & Plan Note (Signed)
10/22/18:Routine screening mammogram showed a 1.4cm mass in the left breast at the 2:00 position with no left axillary adenopathy. Biopsy showed IDC, grade 2, HER-2 equivocal (2+), ER+ 80%, PR+ 10%, Ki67 15%.  11/10/2018:Left lumpectomy Courtney Williams): IDC, grade 2, 1.4cm, clear margins, and micrometastasis in 1 left axillary lymph node.  ER 80%, PR 10%, Ki-67 15%, HER-2 equivocal 2+ by IHC but FISH negative  Oncotype DX score: 22 distant recurrence at 9 years with hormone therapy alone 18% Recommendation: Based upon tailor Rx data, I recommended systemic chemotherapy with Taxotere and Cytoxan every 3 weeks x4 cycles  Chemotherapy Counseling: I discussed the risks and benefits of chemotherapy including the risks of nausea/ vomiting, risk of infection from low WBC count, fatigue due to chemo or anemia, bruising or bleeding due to low platelets, mouth sores, loss/ change in taste and decreased appetite. Liver and kidney function will be monitored through out chemotherapy as abnormalities in liver and kidney function may be a side effect of treatment. Risk of permanent bone marrow dysfunction and leukemia due to chemo were also discussed.  Recommended participation SWOG 1417 clinical trial for neuropathy. Return to clinic in 2 weeks to start chemotherapy.

## 2018-12-03 NOTE — Progress Notes (Signed)
[name] here with complaint of vaginal symptoms of itching, burning, and increase discharge which she treated with Monistat one day and noted some red discharge with wiping during treatment and continued for 2-3 days. She feels this is better, but not sure and would like to be screened for vaginal infection and STD screening. Was separated from wife earlier in the year, but together again. Described discharge as white, but has cleared.She still is having some vaginal bleeding. Has not had period since May, and had not called due to other issues.  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 Recent surgery with lumpectomy and lymph nodes removal.  Had visit with oncology today and due to type of cancer, recommended chemotherapy. Patient overwhelmed at present, but feel she will work through this. No other health issues today.  Review of Systems  Constitutional:       Irregular cycle, fatigue  HENT: Negative.   Eyes: Negative.   Respiratory: Negative.   Cardiovascular: Negative.   Gastrointestinal: Negative.   Genitourinary: Negative for urgency.       Yeast  Musculoskeletal: Negative.   Skin: Negative.   Neurological: Negative.   Endo/Heme/Allergies: Negative.   Psychiatric/Behavioral: Negative.     O:Healthy female WDWN Affect: normal, orientation x 3  Exam: Skin: warm and dry Abdomen:Soft,non tender,no rebound or masses  Inguinal Lymph nodes: no enlargement or tenderness Pelvic exam: External genital: normal female with slight redness on external skin, no lesions BUS: negative, Bladder: non tender, urethra and meatus non tender Vagina: thin white watery discharge noted, Affirm taken, Gonorrhea and Chlamydia screening taken Cervix: normal, non tender, no CMT, but red blood noted from cervix, patient feels it is her period finally Uterus: non tender, enlarged ?6 week size Adnexa:normal, non tender, no masses or fullness noted  on right, questionable mass noted on left, non tender Rectal area: normal no lesions   A:Normal pelvic exam Enlarged uterus with possible adnexal mass on left Amenorrhea x ? 4 months with history of irregular periods, menses onset Recent diagnosis of left breast cancer with lumpectomy and recommendation today from oncology chemotherapy recommended R/O vaginal infection STD screening   P:Discussed findings of possible BV or yeast will await affirm results and treat. Patient agreeable.Discussed Aveeno or baking soda sitz bath for comfort.  Lab: Affirm, Gc/chlamydia Discussed uterine and adnexal finding and need for evaluation with PUS. Discussed possible just enlarged uterus with fibroid or possible adenomyosis  Or mass with irregular period and low FSH with perimenopause. Patient is also hypothyroid and this can contribute to menses change in addition to perimenopause. Questions addressed. Patient will be called to schedule PUS and with insurance information. Instructed to monitor bleeding if abnormal needs to advise. Patient had felt it was just period. Lab: TSH, FSH,Prolactin Lab: HIV,RPR,Hep C. Pap smear taken  Rv as above and prn.

## 2018-12-04 ENCOUNTER — Encounter: Payer: Self-pay | Admitting: Certified Nurse Midwife

## 2018-12-04 LAB — VAGINITIS/VAGINOSIS, DNA PROBE
Candida Species: NEGATIVE
Gardnerella vaginalis: POSITIVE — AB
Trichomonas vaginosis: NEGATIVE

## 2018-12-04 LAB — PROLACTIN: Prolactin: 8.1 ng/mL (ref 4.8–23.3)

## 2018-12-04 LAB — FOLLICLE STIMULATING HORMONE: FSH: 35.2 m[IU]/mL

## 2018-12-04 LAB — HIV ANTIBODY (ROUTINE TESTING W REFLEX): HIV Screen 4th Generation wRfx: NONREACTIVE

## 2018-12-04 LAB — TSH: TSH: 0.906 u[IU]/mL (ref 0.450–4.500)

## 2018-12-04 LAB — RPR: RPR Ser Ql: NONREACTIVE

## 2018-12-04 LAB — HEPATITIS C ANTIBODY: Hep C Virus Ab: <0.1 s/co ratio (ref 0.0–0.9)

## 2018-12-04 NOTE — Patient Instructions (Signed)

## 2018-12-06 ENCOUNTER — Encounter: Payer: Self-pay | Admitting: Physical Therapy

## 2018-12-06 ENCOUNTER — Other Ambulatory Visit: Payer: Self-pay

## 2018-12-06 ENCOUNTER — Telehealth: Payer: Self-pay | Admitting: *Deleted

## 2018-12-06 ENCOUNTER — Ambulatory Visit: Payer: 59 | Attending: General Surgery | Admitting: Physical Therapy

## 2018-12-06 ENCOUNTER — Encounter: Payer: Self-pay | Admitting: *Deleted

## 2018-12-06 DIAGNOSIS — C50412 Malignant neoplasm of upper-outer quadrant of left female breast: Secondary | ICD-10-CM | POA: Diagnosis not present

## 2018-12-06 DIAGNOSIS — R293 Abnormal posture: Secondary | ICD-10-CM

## 2018-12-06 DIAGNOSIS — Z170 Estrogen receptor positive status [ER+]: Secondary | ICD-10-CM | POA: Diagnosis present

## 2018-12-06 DIAGNOSIS — Z483 Aftercare following surgery for neoplasm: Secondary | ICD-10-CM | POA: Diagnosis present

## 2018-12-06 DIAGNOSIS — N9489 Other specified conditions associated with female genital organs and menstrual cycle: Secondary | ICD-10-CM

## 2018-12-06 DIAGNOSIS — N852 Hypertrophy of uterus: Secondary | ICD-10-CM

## 2018-12-06 MED ORDER — METRONIDAZOLE 500 MG PO TABS: 500.0000 mg | ORAL_TABLET | Freq: Two times a day (BID) | ORAL | 0 refills | Status: DC

## 2018-12-06 NOTE — Therapy (Signed)
[place], [place], [address] Phone: [phone]   Fax:  [phone]  Physical Therapy Treatment  Patient Details  Name: Courtney Williams MRN: [id number] Date of Birth: 1970-08-06 Referring Provider (PT): Dr. Rolm Bookbinder   Encounter Date: 12/06/2018  PT End of Session - 12/06/18 1515    Visit Number  2    Number of Visits  2    PT Start Time  4008    PT Stop Time  1515    PT Time Calculation (min)  54 min    Activity Tolerance  Patient tolerated treatment well    Behavior During Therapy  [facility] for tasks assessed/performed       Past Medical History:  Diagnosis Date  . Anxiety   . Breast cancer ([place])    Left Breast  . Family history of CLL (chronic lymphoid leukemia)   . Hypoglycemia   . Hypothyroidism   . Migraines   . Thyroid disease    hypothyroid    Past Surgical History:  Procedure Laterality Date  . BREAST LUMPECTOMY WITH RADIOACTIVE SEED AND SENTINEL LYMPH NODE BIOPSY Left 11/10/2018   Procedure: LEFT BREAST LUMPECTOMY WITH RADIOACTIVE SEED AND LEFT AXILLARY SENTINEL LYMPH NODE BIOPSY;  Surgeon: Rolm Bookbinder, MD;  Location: [facility];  Service: General;  Laterality: Left;  . NO PAST SURGERIES      There were no vitals filed for this visit.  Subjective Assessment - 12/06/18 1429    Subjective  Patient reports she underwent a left lumpectomy and sentinel node biopsy (1/1 node positive with micrometastasis) on 11/10/2018. Her oncotype score was 22 indicating an 18% recurrence risk without chemo so she is trying is decide if she should undergo chemo. She will try to decide soon but will at minimum undergo radiation.    Pertinent History  Patient was diagnosed on 10/11/2018 with left grade II invasive ductal carcinoma breast cancer. Patient reports she underwent a left lumpectomy and sentinel node biopsy (1/1 node positive with micrometastasis) on 11/10/2018. It is ER/PR positive and HER2  negative with a Ki67 of 15%.    Patient Stated Goals  See if my arm is ok    Currently in Pain?  No/denies         [facility] PT Assessment - 12/06/18 0001      Assessment   Medical Diagnosis  s/p left lumpectomy and SLNB    Referring Provider (PT)  Dr. Rolm Bookbinder    Onset Date/Surgical Date  11/10/18    Hand Dominance  Right    Prior Therapy  Baselines      Precautions   Precautions  Other (comment)    Precaution Comments  recent surgery and left arm lymphedema risk      Restrictions   Weight Bearing Restrictions  No      Balance Screen   Has the patient fallen in the past 6 months  No    Has the patient had a decrease in activity level because of a fear of falling?   No    Is the patient reluctant to leave their home because of a fear of falling?   No      Home Environment   Living Environment  Private residence    Living Arrangements  Spouse/significant other   Lives with her wife   Available Help at Discharge  Family      Prior Function   Level of [place]  Full time employment    Herbalist    Leisure  She is not exercising      Cognition   Overall Cognitive Status  Within Functional Limits for tasks assessed      Observation/Other Assessments   Observations  Patient only has 1 incision near her left axilla which was used for the sentinel node biopsy and to remove her mass. Incision appear to be healing well. No edema noted.      Posture/Postural Control   Posture/Postural Control  Postural limitations    Postural Limitations  Rounded Shoulders;Forward head      ROM / Strength   AROM / PROM / Strength  AROM      AROM   AROM Assessment Site  Shoulder    Right/Left Shoulder  Left    Left Shoulder Extension  49 Degrees    Left Shoulder Flexion  156 Degrees    Left Shoulder ABduction  159 Degrees    Left Shoulder Internal Rotation  64 Degrees    Left Shoulder External Rotation  90 Degrees       Strength   Overall Strength  Within functional limits for tasks performed        LYMPHEDEMA/ONCOLOGY QUESTIONNAIRE - 12/06/18 1452      Type   Cancer Type  Left breast cancer      Surgeries   Lumpectomy Date  11/10/18    Sentinel Lymph Node Biopsy Date  11/10/18    Number Lymph Nodes Removed  1      Treatment   Active Chemotherapy Treatment  No    Past Chemotherapy Treatment  No    Active Radiation Treatment  No    Past Radiation Treatment  No    Current Hormone Treatment  No    Past Hormone Therapy  No      What other symptoms do you have   Are you Having Heaviness or Tightness  No    Are you having Pain  No    Are you having pitting edema  No    Is it Hard or Difficult finding clothes that fit  No    Do you have infections  No    Is there Decreased scar mobility  No    Stemmer Sign  No      Lymphedema Assessments   Lymphedema Assessments  Upper extremities      Right Upper Extremity Lymphedema   10 cm Proximal to Olecranon Process  29.8 cm    Olecranon Process  24.6 cm    10 cm Proximal to Ulnar Styloid Process  23.4 cm    Just Proximal to Ulnar Styloid Process  15.4 cm    Across Hand at [employer]  18.7 cm    At [place] of 2nd Digit  6 cm      Left Upper Extremity Lymphedema   10 cm Proximal to Olecranon Process  29.9 cm    Olecranon Process  24.7 cm    10 cm Proximal to Ulnar Styloid Process  23.2 cm    Just Proximal to Ulnar Styloid Process  15.4 cm    Across Hand at [employer]  18 cm    At [place] of 2nd Digit  5.8 cm        Quick Dash - 12/06/18 0001    Open a tight or new jar  No difficulty    Do heavy household chores (wash walls, wash floors)  No difficulty  Carry a shopping bag or briefcase  No difficulty    Wash your back  No difficulty    Use a knife to cut food  No difficulty    Recreational activities in which you take some force or impact through your arm, shoulder, or hand (golf, hammering, tennis)  No difficulty    During the past  week, to what extent has your arm, shoulder or hand problem interfered with your normal social activities with family, friends, neighbors, or groups?  Not at all    During the past week, to what extent has your arm, shoulder or hand problem limited your work or other regular daily activities  Not at all    Arm, shoulder, or hand pain.  None    Tingling (pins and needles) in your arm, shoulder, or hand  None    Difficulty Sleeping  No difficulty    DASH Score  0 %                     PT Education - 12/06/18 1515    Education Details  Closed chain shoulder flexion and abduction stretch    Person(s) Educated  Patient    Methods  Explanation;Demonstration;Handout    Comprehension  Returned demonstration;Verbalized understanding          PT Long Term Goals - 12/06/18 1520      PT LONG TERM GOAL #1   Title  Patient will demonstrate she has regained full shoulder ROM and function post operatively compared to baseline assessments.    Time  8    Period  Weeks    Status  Achieved            Plan - 12/06/18 1516    Clinical Impression Statement  Patient is doing very well s/p left lumpectomy and sentinel node biopsy (1/1 positive nodes) on 11/10/2018. She had Oncotype testing done and chemotherapy is being recommended. She is undecided about whether to undergo treatment but will at minimum undergo radiation. Her shoulder ROM and function has returned to baseline and she has no early signs of lymphedema. She has no other PT needs at this time.    PT Treatment/Interventions  ADLs/Self Care Home Management;Therapeutic exercise;Patient/family education    PT Next Visit Plan  D/C    PT Home Exercise Plan  Closed chain shoulder flexion and abduction    Consulted and Agree with Plan of Care  Patient       Patient will benefit from skilled therapeutic intervention in order to improve the following deficits and impairments:  Decreased knowledge of precautions, Impaired UE  functional use, Pain, Postural dysfunction, Decreased range of motion  Visit Diagnosis: Malignant neoplasm of upper-outer quadrant of left breast in female, estrogen receptor positive (HCC)  Abnormal posture  Aftercare following surgery for neoplasm     Problem List Patient Active Problem List   Diagnosis Date Noted  . Genetic testing 11/08/2018  . Family history of CLL (chronic lymphoid leukemia)   . Malignant neoplasm of upper-outer quadrant of left breast in female, estrogen receptor positive ([place]) 10/22/2018  . Unspecified hypothyroidism 05/03/2013   PHYSICAL THERAPY DISCHARGE SUMMARY  Visits from Start of Care: 2  Current functional level related to goals / functional outcomes: Goal met. See above for objective findings.   Remaining deficits: None   Education / Equipment: HEP and lymphedema risk reduction Plan: Patient agrees to discharge.  Patient goals were met. Patient is being discharged due to meeting the stated  rehab goals.  ?????         Annia Friendly, [place] 12/06/18 3:39 PM  [place], [place], [address] Phone: [phone]   Fax:  [phone]  Name: Courtney Williams MRN: [id number] Date of Birth: 1970/07/18

## 2018-12-06 NOTE — Telephone Encounter (Signed)
Left message to call Dacota Devall, RN at [facility] [phone].   

## 2018-12-06 NOTE — Patient Instructions (Signed)
Closed Chain: Shoulder Flexion / Extension - on Wall    Hands on wall, step backward. Return. Stepping causes shoulder flexion and extension Do _5__ times, holding 5 seconds, _2-3__ times per day.  http://ss.exer.us/265   Copyright  VHI. All rights reserved.  Closed Chain: Shoulder Abduction / Adduction - on Wall    One hand on wall, step to side and return. Stepping causes shoulder to abduct and adduct. Lean your hipa away from the wall, holding 5 seconds, _2-3__ times per day.  http://ss.exer.us/267   Copyright  VHI. All rights reserved.

## 2018-12-06 NOTE — Telephone Encounter (Signed)
-----   Message from Regina Eck, CNM sent at 12/04/2018  3:20 PM EST ----- Patient needs PUS due to possible enlarged uterus/ adnexal mass on left.Recent lumpectomy for breast cancer see note. She is aware she will be called to schedule PUS and insurance information. Please schedule soon.

## 2018-12-07 ENCOUNTER — Other Ambulatory Visit: Payer: Self-pay | Admitting: Hematology and Oncology

## 2018-12-07 ENCOUNTER — Telehealth: Payer: Self-pay | Admitting: Hematology and Oncology

## 2018-12-07 LAB — CYTOLOGY - PAP
Adequacy: ABSENT
Diagnosis: NEGATIVE

## 2018-12-07 LAB — GC/CHLAMYDIA PROBE AMP
Chlamydia trachomatis, NAA: NEGATIVE
Neisseria Gonorrhoeae by PCR: NEGATIVE

## 2018-12-07 MED ORDER — ALPRAZOLAM 0.25 MG PO TABS: 0.2500 mg | ORAL_TABLET | Freq: Two times a day (BID) | ORAL | 0 refills | Status: DC | PRN

## 2018-12-07 NOTE — Telephone Encounter (Signed)
Patient is having a hard time making decision regarding chemotherapy. I instructed her that she has at least another few days to make up her mind.  Please will need at least 2 weeks after the decision has been made to start her chemo. Because of high anxiety I prescribed her Xanax 0.25 twice daily as needed.

## 2018-12-08 NOTE — Telephone Encounter (Signed)
Spoke with patient. PUS scheduled for 12/14/18 at 12:30pm, consult at 1pm with Dr. Talbert Nan.   Order placed for precert.   Patient verbalizes understanding and is agreeable.  Routing to provider for final review. Patient is agreeable to disposition. Will close encounter.  Cc: Dr. Talbert Nan, [place], [place]

## 2018-12-13 ENCOUNTER — Telehealth: Payer: Self-pay | Admitting: *Deleted

## 2018-12-13 ENCOUNTER — Telehealth: Payer: Self-pay | Admitting: Hematology and Oncology

## 2018-12-13 ENCOUNTER — Other Ambulatory Visit: Payer: Self-pay | Admitting: Hematology and Oncology

## 2018-12-13 DIAGNOSIS — C50412 Malignant neoplasm of upper-outer quadrant of left female breast: Secondary | ICD-10-CM

## 2018-12-13 MED ORDER — PROCHLORPERAZINE MALEATE 10 MG PO TABS: 10.0000 mg | ORAL_TABLET | Freq: Four times a day (QID) | ORAL | 1 refills | Status: DC | PRN

## 2018-12-13 MED ORDER — ONDANSETRON HCL 8 MG PO TABS: 8.0000 mg | ORAL_TABLET | Freq: Two times a day (BID) | ORAL | 1 refills | Status: DC | PRN

## 2018-12-13 MED ORDER — DEXAMETHASONE 4 MG PO TABS: 4.0000 mg | ORAL_TABLET | Freq: Every day | ORAL | 1 refills | Status: DC

## 2018-12-13 MED ORDER — LIDOCAINE-PRILOCAINE 2.5-2.5 % EX CREA: TOPICAL_CREAM | CUTANEOUS | 3 refills | Status: DC

## 2018-12-13 NOTE — Telephone Encounter (Signed)
Patient made up her mind to proceed with chemotherapy. I will request Dr. [name] to place a port and will inform her navigators to arrange for the treatment.

## 2018-12-13 NOTE — Telephone Encounter (Signed)
Received call from pt requesting to speak with Dr. Lindi Adie.  Pt states she has made a decision regarding chemotherapy treatment and would like to speak with MD regarding options and tx.  RN will relay message to MD.

## 2018-12-13 NOTE — Progress Notes (Signed)
GYNECOLOGY  VISIT   HPI: 48 y.o.   Married White or Caucasian Not Hispanic or Latino  female   G1P1001 with Patient's last menstrual period was 11/29/2018 (exact date).   here for evaluation of an enlarged uterus and possible left adnexal mass noted on exam by Evalee Mutton, CNM.    She has a h/o irregular menses. Recent normal TSH/prolactin and slightly elevated FSH.  Recently s/p lumpectomy for breast cancer.   GYNECOLOGIC HISTORY: Patient's last menstrual period was 11/29/2018 (exact date). Contraception: female partner Menopausal hormone therapy: none        OB History    Gravida  1   Para  1   Term  1   Preterm      AB      Living  1     SAB      TAB      Ectopic      Multiple      Live Births  1              Patient Active Problem List   Diagnosis Date Noted  . Genetic testing 11/08/2018  . Family history of CLL (chronic lymphoid leukemia)   . Malignant neoplasm of upper-outer quadrant of left breast in female, estrogen receptor positive ([place]) 10/22/2018  . Unspecified hypothyroidism 05/03/2013    Past Medical History:  Diagnosis Date  . Anxiety   . Breast cancer ([place])    Left Breast  . Family history of CLL (chronic lymphoid leukemia)   . Hypoglycemia   . Hypothyroidism   . Migraines   . Thyroid disease    hypothyroid    Past Surgical History:  Procedure Laterality Date  . BREAST LUMPECTOMY WITH RADIOACTIVE SEED AND SENTINEL LYMPH NODE BIOPSY Left 11/10/2018   Procedure: LEFT BREAST LUMPECTOMY WITH RADIOACTIVE SEED AND LEFT AXILLARY SENTINEL LYMPH NODE BIOPSY;  Surgeon: Rolm Bookbinder, MD;  Location: [facility];  Service: General;  Laterality: Left;  . NO PAST SURGERIES      Current Outpatient Medications  Medication Sig Dispense Refill  . ALPRAZolam (XANAX) 0.25 MG tablet Take 1 tablet (0.25 mg total) by mouth 2 (two) times daily as needed for anxiety. 30 tablet 0  . dexamethasone (DECADRON) 4 MG tablet Take 1 tablet (4 mg total) by  mouth daily. Take 1 tablet day before chemo and 1 tablet day after chemo with food 8 tablet 1  . fexofenadine (ALLEGRA) 180 MG tablet Take 180 mg by mouth daily as needed for allergies.     Marland Kitchen levothyroxine (SYNTHROID) 125 MCG tablet TAKE 1 TABLET (125 MCG TOTAL) BY MOUTH DAILY BEFORE BREAKFAST. 30 tablet 12  . lidocaine-prilocaine (EMLA) cream Apply to affected area once 30 g 3  . ondansetron (ZOFRAN) 8 MG tablet Take 1 tablet (8 mg total) by mouth 2 (two) times daily as needed for refractory nausea / vomiting. Start on day 3 after chemo. 30 tablet 1  . prochlorperazine (COMPAZINE) 10 MG tablet Take 1 tablet (10 mg total) by mouth every 6 (six) hours as needed (Nausea or vomiting). 30 tablet 1   No current facility-administered medications for this visit.      ALLERGIES: Patient has no known allergies.  Family History  Problem Relation Age of Onset  . Thyroid disease Father   . Leukemia Father 11       CLL  . Heart attack Maternal Aunt   . Heart disease Maternal Grandmother   . Breast cancer Neg Hx  Social History   Socioeconomic History  . Marital status: Married    Spouse name: Not on file  . Number of children: Not on file  . Years of education: Not on file  . Highest education level: Not on file  Occupational History  . Not on file  Social Needs  . Financial resource strain: Not on file  . Food insecurity    Worry: Not on file    Inability: Not on file  . Transportation needs    Medical: Not on file    Non-medical: Not on file  Tobacco Use  . Smoking status: Current Some Day Smoker    Last attempt to quit: 01/21/2011    Years since quitting: 7.9  . Smokeless tobacco: Never Used  . Tobacco comment: rare  Substance and Sexual Activity  . Alcohol use: Yes    Alcohol/week: 20.0 standard drinks    Types: 20 Standard drinks or equivalent per week  . Drug use: No  . Sexual activity: Yes    Partners: Female    Comment: same sex partner  Lifestyle  . Physical  activity    Days per week: Not on file    Minutes per session: Not on file  . Stress: Not on file  Relationships  . Social Herbalist on phone: Not on file    Gets together: Not on file    Attends religious service: Not on file    Active member of club or organization: Not on file    Attends meetings of clubs or organizations: Not on file    Relationship status: Not on file  . Intimate partner violence    Fear of current or ex partner: Not on file    Emotionally abused: Not on file    Physically abused: Not on file    Forced sexual activity: Not on file  Other Topics Concern  . Not on file  Social History Narrative  . Not on file    Review of Systems  Constitutional: Negative.   HENT: Negative.   Eyes: Negative.   Respiratory: Negative.   Cardiovascular: Negative.   Gastrointestinal: Negative.   Genitourinary: Negative.   Musculoskeletal: Negative.   Skin: Negative.   Neurological: Negative.   Endo/Heme/Allergies: Negative.   Psychiatric/Behavioral: Negative.     PHYSICAL EXAMINATION:    BP 120/86 (BP Location: Right Arm, Patient Position: Sitting, Cuff Size: Normal)   Pulse 64   Temp (!) 97.1 F (36.2 C) (Skin)   Wt 170 lb 6.4 oz (77.3 kg)   LMP 11/29/2018 (Exact Date)   BMI 31.42 kg/m     General appearance: alert, cooperative and appears stated age  Ultrasound images reviewed with the patient. Normal pelvic ultrasound, endometrial stripe of 5.5 mm, no signs of endometrial polyps.   ASSESSMENT Suspected enlarged uterus and possible adnexal mass on exam, normal ultrasound Irregular menses, normal TSH, prolactin, FSH 35.2. C/W perimenopause H/O breast cancer, s/p lumpectomy/SND last month. She will be starting chemotherapy    PLAN She should calendar her bleeding. I suspect her cycles will stop with Chemotherapy If she continues to bleed over the next several months, would consider endometrial biopsy.   An After Visit Summary was printed and  given to the patient.  CC: Evalee Mutton, CNM

## 2018-12-14 ENCOUNTER — Encounter: Payer: Self-pay | Admitting: Obstetrics and Gynecology

## 2018-12-14 ENCOUNTER — Ambulatory Visit (INDEPENDENT_AMBULATORY_CARE_PROVIDER_SITE_OTHER): Payer: 59 | Admitting: Obstetrics and Gynecology

## 2018-12-14 ENCOUNTER — Other Ambulatory Visit: Payer: Self-pay | Admitting: General Surgery

## 2018-12-14 ENCOUNTER — Ambulatory Visit (INDEPENDENT_AMBULATORY_CARE_PROVIDER_SITE_OTHER): Payer: 59

## 2018-12-14 ENCOUNTER — Telehealth: Payer: Self-pay | Admitting: *Deleted

## 2018-12-14 ENCOUNTER — Other Ambulatory Visit: Payer: Self-pay

## 2018-12-14 VITALS — BP 120/86 | HR 64 | Temp 97.1°F | Wt 170.4 lb

## 2018-12-14 DIAGNOSIS — N852 Hypertrophy of uterus: Secondary | ICD-10-CM

## 2018-12-14 DIAGNOSIS — N9489 Other specified conditions associated with female genital organs and menstrual cycle: Secondary | ICD-10-CM

## 2018-12-14 DIAGNOSIS — N939 Abnormal uterine and vaginal bleeding, unspecified: Secondary | ICD-10-CM

## 2018-12-14 DIAGNOSIS — Z01411 Encounter for gynecological examination (general) (routine) with abnormal findings: Secondary | ICD-10-CM

## 2018-12-14 DIAGNOSIS — Z170 Estrogen receptor positive status [ER+]: Secondary | ICD-10-CM

## 2018-12-14 DIAGNOSIS — C50412 Malignant neoplasm of upper-outer quadrant of left female breast: Secondary | ICD-10-CM

## 2018-12-14 NOTE — Telephone Encounter (Signed)
Spoke with patient regarding starting chemo.  Confirmed appointment for 12/8 at 7:30am.  Informed patient she would have port placed on 12/7 and left accessed for treatment next day.  Orders placed for chemo education.

## 2018-12-15 ENCOUNTER — Telehealth: Payer: Self-pay | Admitting: Hematology and Oncology

## 2018-12-15 ENCOUNTER — Encounter (HOSPITAL_BASED_OUTPATIENT_CLINIC_OR_DEPARTMENT_OTHER): Payer: Self-pay | Admitting: *Deleted

## 2018-12-15 NOTE — Telephone Encounter (Signed)
Scheduled appt per 11/24 sch message - unable to reach pt . Left message with appt date and time .   

## 2018-12-21 ENCOUNTER — Telehealth: Payer: Self-pay | Admitting: Hematology and Oncology

## 2018-12-21 ENCOUNTER — Inpatient Hospital Stay: Payer: 59 | Attending: Hematology and Oncology

## 2018-12-21 ENCOUNTER — Other Ambulatory Visit: Payer: Self-pay

## 2018-12-21 DIAGNOSIS — C50412 Malignant neoplasm of upper-outer quadrant of left female breast: Secondary | ICD-10-CM | POA: Insufficient documentation

## 2018-12-21 DIAGNOSIS — G43909 Migraine, unspecified, not intractable, without status migrainosus: Secondary | ICD-10-CM | POA: Insufficient documentation

## 2018-12-21 DIAGNOSIS — Z79899 Other long term (current) drug therapy: Secondary | ICD-10-CM | POA: Insufficient documentation

## 2018-12-21 DIAGNOSIS — Z5189 Encounter for other specified aftercare: Secondary | ICD-10-CM | POA: Insufficient documentation

## 2018-12-21 DIAGNOSIS — T451X5A Adverse effect of antineoplastic and immunosuppressive drugs, initial encounter: Secondary | ICD-10-CM | POA: Insufficient documentation

## 2018-12-21 DIAGNOSIS — M898X9 Other specified disorders of bone, unspecified site: Secondary | ICD-10-CM | POA: Insufficient documentation

## 2018-12-21 DIAGNOSIS — R519 Headache, unspecified: Secondary | ICD-10-CM | POA: Insufficient documentation

## 2018-12-21 DIAGNOSIS — Z5111 Encounter for antineoplastic chemotherapy: Secondary | ICD-10-CM | POA: Insufficient documentation

## 2018-12-21 DIAGNOSIS — Z170 Estrogen receptor positive status [ER+]: Secondary | ICD-10-CM | POA: Insufficient documentation

## 2018-12-21 NOTE — Telephone Encounter (Signed)
Per discussion with desk nurse and 12/1 schedule message added injection appointments two days after each chemo and cxd 2/23 appointments. Per desk nurse patient will get new schedule at education class this morning.

## 2018-12-23 ENCOUNTER — Encounter: Payer: Self-pay | Admitting: Hematology and Oncology

## 2018-12-23 ENCOUNTER — Other Ambulatory Visit (HOSPITAL_COMMUNITY)
Admission: RE | Admit: 2018-12-23 | Discharge: 2018-12-23 | Disposition: A | Discharge status: Home / Self Care | Payer: 59 | Source: Ambulatory Visit | Attending: General Surgery | Admitting: General Surgery

## 2018-12-23 DIAGNOSIS — Z01812 Encounter for preprocedural laboratory examination: Secondary | ICD-10-CM | POA: Diagnosis not present

## 2018-12-23 DIAGNOSIS — Z20828 Contact with and (suspected) exposure to other viral communicable diseases: Secondary | ICD-10-CM | POA: Insufficient documentation

## 2018-12-23 NOTE — Progress Notes (Signed)

## 2018-12-23 NOTE — Progress Notes (Signed)
Called pt to introduce myself as her Arboriculturist and to discuss copay assistance.  Pt has met her deductible for the year and her plan doesn't renew until June 2021 so copay assistance shouldn't be needed at this time.  I offered the [employer], went over what it covers and gave her the income requirement, pt stated she exceeds the requirement so she doesn't qualify for the grant at this time.  I requested for the registration staff to give her my card at her next visit for any questions or concerns she may have in the future.

## 2018-12-26 LAB — NOVEL CORONAVIRUS, NAA (HOSP ORDER, SEND-OUT TO REF LAB; TAT 18-24 HRS): SARS-CoV-2, NAA: NOT DETECTED

## 2018-12-27 ENCOUNTER — Ambulatory Visit (HOSPITAL_BASED_OUTPATIENT_CLINIC_OR_DEPARTMENT_OTHER)
Admission: RE | Admit: 2018-12-27 | Discharge: 2018-12-27 | Disposition: A | Discharge status: Home / Self Care | Payer: 59 | Attending: General Surgery | Admitting: General Surgery

## 2018-12-27 ENCOUNTER — Encounter (HOSPITAL_BASED_OUTPATIENT_CLINIC_OR_DEPARTMENT_OTHER)
Admission: RE | Disposition: A | Discharge status: Home / Self Care | Payer: Self-pay | Source: Home / Self Care | Attending: General Surgery

## 2018-12-27 ENCOUNTER — Encounter (HOSPITAL_BASED_OUTPATIENT_CLINIC_OR_DEPARTMENT_OTHER): Payer: Self-pay

## 2018-12-27 ENCOUNTER — Ambulatory Visit (HOSPITAL_COMMUNITY): Payer: 59

## 2018-12-27 ENCOUNTER — Ambulatory Visit (HOSPITAL_BASED_OUTPATIENT_CLINIC_OR_DEPARTMENT_OTHER): Payer: 59 | Admitting: Anesthesiology

## 2018-12-27 ENCOUNTER — Other Ambulatory Visit: Payer: Self-pay

## 2018-12-27 DIAGNOSIS — F419 Anxiety disorder, unspecified: Secondary | ICD-10-CM | POA: Diagnosis not present

## 2018-12-27 DIAGNOSIS — F17210 Nicotine dependence, cigarettes, uncomplicated: Secondary | ICD-10-CM | POA: Insufficient documentation

## 2018-12-27 DIAGNOSIS — C50912 Malignant neoplasm of unspecified site of left female breast: Secondary | ICD-10-CM | POA: Diagnosis present

## 2018-12-27 DIAGNOSIS — E039 Hypothyroidism, unspecified: Secondary | ICD-10-CM | POA: Insufficient documentation

## 2018-12-27 DIAGNOSIS — Z95828 Presence of other vascular implants and grafts: Secondary | ICD-10-CM

## 2018-12-27 DIAGNOSIS — Z79890 Hormone replacement therapy (postmenopausal): Secondary | ICD-10-CM | POA: Diagnosis not present

## 2018-12-27 HISTORY — PX: PORTACATH PLACEMENT: SHX2246

## 2018-12-27 LAB — COMPREHENSIVE METABOLIC PANEL
ALT: 10 U/L (ref 0–44)
AST: 9 U/L — ABNORMAL LOW (ref 15–41)
Albumin: 2.9 g/dL — ABNORMAL LOW (ref 3.5–5.0)
Alkaline Phosphatase: 30 U/L — ABNORMAL LOW (ref 38–126)
Anion gap: 10 (ref 5–15)
BUN: 8 mg/dL (ref 6–20)
CO2: 22 mmol/L (ref 22–32)
Calcium: 7.2 mg/dL — ABNORMAL LOW (ref 8.9–10.3)
Chloride: 110 mmol/L (ref 98–111)
Creatinine, Ser: 0.59 mg/dL (ref 0.44–1.00)
GFR calc Af Amer: >60 mL/min (ref >60)
GFR calc non Af Amer: >60 mL/min (ref >60)
Glucose, Bld: 97 mg/dL (ref 70–99)
Potassium: 3.1 mmol/L — ABNORMAL LOW (ref 3.5–5.1)
Sodium: 142 mmol/L (ref 135–145)
Total Bilirubin: 0.3 mg/dL (ref 0.3–1.2)
Total Protein: 4.8 g/dL — ABNORMAL LOW (ref 6.5–8.1)

## 2018-12-27 LAB — CBC WITH DIFFERENTIAL/PLATELET
Abs Immature Granulocytes: 0.02 10*3/uL (ref 0.00–0.07)
Basophils Absolute: 0 10*3/uL (ref 0.0–0.1)
Basophils Relative: 1 %
Eosinophils Absolute: 0.1 10*3/uL (ref 0.0–0.5)
Eosinophils Relative: 2 %
HCT: 34.3 % — ABNORMAL LOW (ref 36.0–46.0)
Hemoglobin: 11.2 g/dL — ABNORMAL LOW (ref 12.0–15.0)
Immature Granulocytes: 0 %
Lymphocytes Relative: 24 %
Lymphs Abs: 1.9 10*3/uL (ref 0.7–4.0)
MCH: 30.6 pg (ref 26.0–34.0)
MCHC: 32.7 g/dL (ref 30.0–36.0)
MCV: 93.7 fL (ref 80.0–100.0)
Monocytes Absolute: 0.7 10*3/uL (ref 0.1–1.0)
Monocytes Relative: 9 %
Neutro Abs: 5.1 10*3/uL (ref 1.7–7.7)
Neutrophils Relative %: 64 %
Platelets: 310 10*3/uL (ref 150–400)
RBC: 3.66 MIL/uL — ABNORMAL LOW (ref 3.87–5.11)
RDW: 12.7 % (ref 11.5–15.5)
WBC: 7.9 10*3/uL (ref 4.0–10.5)
nRBC: 0 % (ref 0.0–0.2)

## 2018-12-27 LAB — POCT PREGNANCY, URINE: Preg Test, Ur: NEGATIVE

## 2018-12-27 SURGERY — INSERTION, TUNNELED CENTRAL VENOUS DEVICE, WITH PORT
Anesthesia: General | Site: Chest | Laterality: Right

## 2018-12-27 MED ORDER — FENTANYL CITRATE (PF) 100 MCG/2ML IJ SOLN
INTRAMUSCULAR | Status: AC
  Filled 2018-12-27: qty 2

## 2018-12-27 MED ORDER — LACTATED RINGERS IV SOLN
INTRAVENOUS | Status: DC
  Administered 2018-12-27: 11:00:00 via INTRAVENOUS

## 2018-12-27 MED ORDER — HEPARIN (PORCINE) IN NACL 1000-0.9 UT/500ML-% IV SOLN
INTRAVENOUS | Status: AC
  Filled 2018-12-27: qty 500

## 2018-12-27 MED ORDER — BUPIVACAINE HCL (PF) 0.25 % IJ SOLN
INTRAMUSCULAR | Status: DC | PRN
  Administered 2018-12-27: 10 mL

## 2018-12-27 MED ORDER — FENTANYL CITRATE (PF) 100 MCG/2ML IJ SOLN
INTRAMUSCULAR | Status: DC | PRN
  Administered 2018-12-27: 100 ug via INTRAVENOUS

## 2018-12-27 MED ORDER — CEFAZOLIN SODIUM-DEXTROSE 2-4 GM/100ML-% IV SOLN
2.0000 g | INTRAVENOUS | Status: AC
  Administered 2018-12-27: 2 g via INTRAVENOUS

## 2018-12-27 MED ORDER — MIDAZOLAM HCL 2 MG/2ML IJ SOLN
INTRAMUSCULAR | Status: AC
  Filled 2018-12-27: qty 2

## 2018-12-27 MED ORDER — OXYCODONE HCL 5 MG/5ML PO SOLN: 5.0000 mg | Freq: Once | ORAL | Status: AC | PRN

## 2018-12-27 MED ORDER — HEPARIN SOD (PORK) LOCK FLUSH 100 UNIT/ML IV SOLN
INTRAVENOUS | Status: DC | PRN
  Administered 2018-12-27: 500 [IU] via INTRAVENOUS

## 2018-12-27 MED ORDER — ACETAMINOPHEN 500 MG PO TABS
ORAL_TABLET | ORAL | Status: AC
  Filled 2018-12-27: qty 2

## 2018-12-27 MED ORDER — MIDAZOLAM HCL 2 MG/2ML IJ SOLN
INTRAMUSCULAR | Status: DC | PRN
  Administered 2018-12-27: 2 mg via INTRAVENOUS

## 2018-12-27 MED ORDER — LIDOCAINE HCL (CARDIAC) PF 100 MG/5ML IV SOSY
PREFILLED_SYRINGE | INTRAVENOUS | Status: DC | PRN
  Administered 2018-12-27: 50 mg via INTRAVENOUS

## 2018-12-27 MED ORDER — EPHEDRINE SULFATE 50 MG/ML IJ SOLN
INTRAMUSCULAR | Status: DC | PRN
  Administered 2018-12-27: 10 mg via INTRAVENOUS

## 2018-12-27 MED ORDER — BUPIVACAINE HCL (PF) 0.25 % IJ SOLN
INTRAMUSCULAR | Status: AC
  Filled 2018-12-27: qty 30

## 2018-12-27 MED ORDER — HEPARIN SOD (PORK) LOCK FLUSH 100 UNIT/ML IV SOLN
INTRAVENOUS | Status: AC
  Filled 2018-12-27: qty 5

## 2018-12-27 MED ORDER — DEXAMETHASONE SODIUM PHOSPHATE 10 MG/ML IJ SOLN
INTRAMUSCULAR | Status: DC | PRN
  Administered 2018-12-27: 4 mg via INTRAVENOUS

## 2018-12-27 MED ORDER — HYDROMORPHONE HCL 1 MG/ML IJ SOLN: 0.2500 mg | INTRAMUSCULAR | Status: DC | PRN

## 2018-12-27 MED ORDER — HEPARIN (PORCINE) IN NACL 2-0.9 UNITS/ML
INTRAMUSCULAR | Status: AC | PRN
  Administered 2018-12-27: 500 mL via INTRAVENOUS

## 2018-12-27 MED ORDER — PROMETHAZINE HCL 25 MG/ML IJ SOLN: 6.2500 mg | INTRAMUSCULAR | Status: DC | PRN

## 2018-12-27 MED ORDER — OXYCODONE HCL 5 MG PO TABS: 5.0000 mg | ORAL_TABLET | Freq: Four times a day (QID) | ORAL | 0 refills | Status: DC | PRN

## 2018-12-27 MED ORDER — ONDANSETRON HCL 4 MG/2ML IJ SOLN
INTRAMUSCULAR | Status: DC | PRN
  Administered 2018-12-27: 4 mg via INTRAVENOUS

## 2018-12-27 MED ORDER — PROPOFOL 10 MG/ML IV BOLUS
INTRAVENOUS | Status: DC | PRN
  Administered 2018-12-27: 150 mg via INTRAVENOUS

## 2018-12-27 MED ORDER — ACETAMINOPHEN 500 MG PO TABS
1000.0000 mg | ORAL_TABLET | ORAL | Status: AC
  Administered 2018-12-27: 1000 mg via ORAL

## 2018-12-27 MED ORDER — MEPERIDINE HCL 25 MG/ML IJ SOLN: 6.2500 mg | INTRAMUSCULAR | Status: DC | PRN

## 2018-12-27 MED ORDER — OXYCODONE HCL 5 MG PO TABS
ORAL_TABLET | ORAL | Status: AC
  Filled 2018-12-27: qty 1

## 2018-12-27 MED ORDER — CEFAZOLIN SODIUM-DEXTROSE 2-4 GM/100ML-% IV SOLN
INTRAVENOUS | Status: AC
  Filled 2018-12-27: qty 100

## 2018-12-27 MED ORDER — OXYCODONE HCL 5 MG PO TABS
5.0000 mg | ORAL_TABLET | Freq: Once | ORAL | Status: AC | PRN
  Administered 2018-12-27: 5 mg via ORAL

## 2018-12-27 SURGICAL SUPPLY — 53 items
BAG DECANTER FOR FLEXI CONT (MISCELLANEOUS) ×3 IMPLANT
BENZOIN TINCTURE PRP APPL 2/3 (GAUZE/BANDAGES/DRESSINGS) ×3 IMPLANT
BLADE SURG 11 STRL SS (BLADE) ×3 IMPLANT
BLADE SURG 15 STRL LF DISP TIS (BLADE) ×1 IMPLANT
BLADE SURG 15 STRL SS (BLADE) ×2
CANISTER SUCT 1200ML W/VALVE (MISCELLANEOUS) IMPLANT
CHLORAPREP W/TINT 26 (MISCELLANEOUS) ×3 IMPLANT
CLOSURE WOUND 1/2 X4 (GAUZE/BANDAGES/DRESSINGS) ×1
COVER BACK TABLE REUSABLE LG (DRAPES) ×3 IMPLANT
COVER MAYO STAND REUSABLE (DRAPES) ×3 IMPLANT
COVER PROBE 5X48 (MISCELLANEOUS) ×2
COVER WAND RF STERILE (DRAPES) IMPLANT
DECANTER SPIKE VIAL GLASS SM (MISCELLANEOUS) IMPLANT
DERMABOND ADVANCED (GAUZE/BANDAGES/DRESSINGS) ×2
DERMABOND ADVANCED .7 DNX12 (GAUZE/BANDAGES/DRESSINGS) ×1 IMPLANT
DRAPE C-ARM 42X72 X-RAY (DRAPES) ×3 IMPLANT
DRAPE LAPAROSCOPIC ABDOMINAL (DRAPES) ×3 IMPLANT
DRAPE UTILITY XL STRL (DRAPES) ×3 IMPLANT
DRSG TEGADERM 4X4.75 (GAUZE/BANDAGES/DRESSINGS) ×6 IMPLANT
ELECT COATED BLADE 2.86 ST (ELECTRODE) ×3 IMPLANT
ELECT REM PT RETURN 9FT ADLT (ELECTROSURGICAL) ×3
ELECTRODE REM PT RTRN 9FT ADLT (ELECTROSURGICAL) ×1 IMPLANT
GAUZE SPONGE 4X4 12PLY STRL LF (GAUZE/BANDAGES/DRESSINGS) ×6 IMPLANT
GLOVE BIO SURGEON STRL SZ7 (GLOVE) ×6 IMPLANT
GLOVE BIOGEL PI IND STRL 7.5 (GLOVE) ×1 IMPLANT
GLOVE BIOGEL PI INDICATOR 7.5 (GLOVE) ×2
GLOVE EXAM NITRILE MD LF STRL (GLOVE) ×3 IMPLANT
GOWN STRL REUS W/ TWL LRG LVL3 (GOWN DISPOSABLE) ×1 IMPLANT
GOWN STRL REUS W/ TWL XL LVL3 (GOWN DISPOSABLE) ×1 IMPLANT
GOWN STRL REUS W/TWL LRG LVL3 (GOWN DISPOSABLE) ×2
GOWN STRL REUS W/TWL XL LVL3 (GOWN DISPOSABLE) ×2
IV KIT MINILOC 20X1 SAFETY (NEEDLE) IMPLANT
KIT CVR 48X5XPRB PLUP LF (MISCELLANEOUS) ×1 IMPLANT
KIT PORT POWER 8FR ISP CVUE (Port) ×3 IMPLANT
NDL SAFETY ECLIPSE 18X1.5 (NEEDLE) IMPLANT
NEEDLE HYPO 18GX1.5 SHARP (NEEDLE)
NEEDLE HYPO 25X1 1.5 SAFETY (NEEDLE) ×3 IMPLANT
PACK BASIN DAY SURGERY FS (CUSTOM PROCEDURE TRAY) ×3 IMPLANT
PENCIL SMOKE EVACUATOR (MISCELLANEOUS) ×3 IMPLANT
SLEEVE SCD COMPRESS KNEE MED (MISCELLANEOUS) ×3 IMPLANT
STRIP CLOSURE SKIN 1/2X4 (GAUZE/BANDAGES/DRESSINGS) ×2 IMPLANT
SUT MNCRL AB 4-0 PS2 18 (SUTURE) ×3 IMPLANT
SUT PROLENE 2 0 SH DA (SUTURE) ×3 IMPLANT
SUT SILK 2 0 TIES 17X18 (SUTURE)
SUT SILK 2-0 18XBRD TIE BLK (SUTURE) IMPLANT
SUT VIC AB 3-0 SH 27 (SUTURE) ×2
SUT VIC AB 3-0 SH 27X BRD (SUTURE) ×1 IMPLANT
SYR 5ML LUER SLIP (SYRINGE) ×3 IMPLANT
SYR CONTROL 10ML LL (SYRINGE) ×6 IMPLANT
TOWEL GREEN STERILE FF (TOWEL DISPOSABLE) ×3 IMPLANT
TUBE CONNECTING 20'X1/4 (TUBING)
TUBE CONNECTING 20X1/4 (TUBING) IMPLANT
YANKAUER SUCT BULB TIP NO VENT (SUCTIONS) IMPLANT

## 2018-12-27 NOTE — Interval H&P Note (Signed)
History and Physical Interval Note:  12/27/2018 12:03 PM  Courtney Williams  has presented today for surgery, with the diagnosis of BREAST CANCER.  The various methods of treatment have been discussed with the patient and family. After consideration of risks, benefits and other options for treatment, the patient has consented to  Procedure(s): INSERTION PORT-A-CATH WITH ULTRASOUND (N/A) as a surgical intervention.  The patient's history has been reviewed, patient examined, no change in status, stable for surgery.  I have reviewed the patient's chart and labs.  Questions were answered to the patient's satisfaction.     Rolm Bookbinder

## 2018-12-27 NOTE — Transfer of Care (Signed)
Immediate Anesthesia Transfer of Care Note  Patient: Courtney Williams  Procedure(s) Performed: INSERTION PORT-A-CATH WITH ULTRASOUND (Right Chest)  Patient Location: PACU  Anesthesia Type:General  Level of Consciousness: awake, alert  and oriented  Airway & Oxygen Therapy: Patient Spontanous Breathing and Patient connected to face mask oxygen  Post-op Assessment: Report given to RN and Post -op Vital signs reviewed and stable  Post vital signs: Reviewed and stable  Last Vitals:  Vitals Value Taken Time  BP    Temp    Pulse    Resp    SpO2      Last Pain:  Vitals:   12/27/18 1112  TempSrc: Oral  PainSc: 0-No pain         Complications: No apparent anesthesia complications

## 2018-12-27 NOTE — Op Note (Signed)
Preoperative diagnosis:breast cancer with high oncotype Postoperative diagnosis: saa Procedure: Right ij port placement with US guidance Surgeon: Dr Serita Grammes EBL: minimal Anesthesia: general  Complications none Drains none Specimens:none Sponge and needle count correct times two dispo to recovery stable  Indications:48 yof who underwent a lumpectomy/sentinel node biopsy and has a high oncotype.  We discussed port placement for chemotherapy.   Procedure:After informed consent was obtained the patient was taken to the operating room. She was given antibiotics. SCDs were placed. She was placed under general anesthesia without complication. She was prepped and draped in the standard sterile surgical fashion. A surgical timeout was then performed.  I identified the internal jugular vein on the right side with the ultrasound. I made a small nick in the skin. I accessed the internal jugular vein with the needle under ultrasound guidance. I passed the wire. The wire was confirmed to be in position with fluoroscopy.The wire was in the vein by ultrasound as well.I then infiltrated Marcaine below the clavicle on the right side. I made an incision and developed a subcutaneous pocket for the port. I then tunneled between the port site as well as the insertion site. I brought the line through this. I then placed the dilator under fluoroscopic guidance over the wire. I removedthe wire andthe dilator. I then placed the line into the sheath. The sheath was then removed. I pulled the line back to be in the distal vena cava. I then hooked this up to the port. This was placed in the pocket and sutured in place with 2-0 Prolene suture. I then closed this with 3-0 Vicryl and 4-0 Monocryl. Glue was placed. I accessed this. It withdrew blood and flushed easily. I packed it with heparin.I left it accessed for chemotherapy tomorrow

## 2018-12-27 NOTE — Anesthesia Postprocedure Evaluation (Signed)
Anesthesia Post Note  Patient: Courtney Williams  Procedure(s) Performed: INSERTION PORT-A-CATH WITH ULTRASOUND (Right Chest)     Patient location during evaluation: PACU Anesthesia Type: General Level of consciousness: awake and alert Pain management: pain level controlled Vital Signs Assessment: post-procedure vital signs reviewed and stable Respiratory status: spontaneous breathing, nonlabored ventilation and respiratory function stable Cardiovascular status: blood pressure returned to baseline and stable Postop Assessment: no apparent nausea or vomiting Anesthetic complications: no    Last Vitals:  Vitals:   12/27/18 1400 12/27/18 1415  BP: 105/61 115/78  Pulse: 70 68  Resp: 17 16  Temp:  36.6 C  SpO2: 99% 100%    Last Pain:  Vitals:   12/27/18 1415  TempSrc:   PainSc: 2                  Lynda Rainwater

## 2018-12-27 NOTE — Anesthesia Procedure Notes (Signed)
Procedure Name: LMA Insertion Date/Time: 12/27/2018 12:21 PM Performed by: Willa Frater, CRNA Pre-anesthesia Checklist: Patient identified, Emergency Drugs available, Suction available and Patient being monitored Patient Re-evaluated:Patient Re-evaluated prior to induction Oxygen Delivery Method: Circle system utilized Preoxygenation: Pre-oxygenation with 100% oxygen Induction Type: IV induction Ventilation: Mask ventilation without difficulty LMA: LMA inserted LMA Size: 4.0 Number of attempts: 1 Airway Equipment and Method: Bite block Placement Confirmation: positive ETCO2 Tube secured with: Tape Dental Injury: Teeth and Oropharynx as per pre-operative assessment

## 2018-12-27 NOTE — Discharge Instructions (Signed)
PORT-A-CATH: POST OP INSTRUCTIONS  Always review your discharge instruction sheet given to you by the facility where your surgery was performed.   1. A prescription for pain medication may be given to you upon discharge. Take your pain medication as prescribed, if needed. If narcotic pain medicine is not needed, then you make take acetaminophen (Tylenol) or ibuprofen (Advil) as needed.  2. Take your usually prescribed medications unless otherwise directed. 3. If you need a refill on your pain medication, please contact our office. All narcotic pain medicine now requires a paper prescription.  Phoned in and fax refills are no longer allowed by law.  Prescriptions will not be filled after 5 pm or on weekends.  4. You should follow a light diet for the remainder of the day after your procedure. 5. Most patients will experience some mild swelling and/or bruising in the area of the incision. It may take several days to resolve. 6. It is common to experience some constipation if taking pain medication after surgery. Increasing fluid intake and taking a stool softener (such as Colace) will usually help or prevent this problem from occurring. A mild laxative (Milk of Magnesia or Miralax) should be taken according to package directions if there are no bowel movements after 48 hours.  7. Unless discharge instructions indicate otherwise, you may remove your bandages 48 hours after surgery, and you may shower at that time. You may have steri-strips (small white skin tapes) in place directly over the incision.  These strips should be left on the skin for 7-10 days.  If your surgeon used Dermabond (skin glue) on the incision, you may shower in 24 hours.  The glue will flake off over the next 2-3 weeks.  8. If your port is left accessed at the end of surgery (needle left in port), the dressing cannot get wet and should only by changed by a healthcare professional. When the port is no longer accessed (when the  needle has been removed), follow step 7.   9. ACTIVITIES:  Limit activity involving your arms for the next 72 hours. Do no strenuous exercise or activity for 1 week. You may drive when you are no longer taking prescription pain medication, you can comfortably wear a seatbelt, and you can maneuver your car. 10.You may need to see your doctor in the office for a follow-up appointment.  Please       check with your doctor.  11.When you receive a new Port-a-Cath, you will get a product guide and        ID card.  Please keep them in case you need them.  WHEN TO CALL YOUR DOCTOR [phone]): 1. Fever over 101.0 2. Chills 3. Continued bleeding from incision 4. Increased redness and tenderness at the site 5. Shortness of breath, difficulty breathing   The clinic staff is available to answer your questions during regular business hours. Please dont hesitate to call and ask to speak to one of the nurses or medical assistants for clinical concerns. If you have a medical emergency, go to the nearest emergency room or call 911.  A surgeon from [facility] is always on call at the hospital.     For further information, please visit [email or web address]     No Tylenol until 5:15 PM on 12/27/2018.     Post Anesthesia Home Care Instructions  Activity: Get plenty of rest for the remainder of the day. A responsible individual must stay with you for 24 hours following  the procedure.  For the next 24 hours, DO NOT: -Drive a car -Paediatric nurse -Drink alcoholic beverages -Take any medication unless instructed by your physician -Make any legal decisions or sign important papers.  Meals: Start with liquid foods such as gelatin or soup. Progress to regular foods as tolerated. Avoid greasy, spicy, heavy foods. If nausea and/or vomiting occur, drink only clear liquids until the nausea and/or vomiting subsides. Call your physician if vomiting continues.  Special  Instructions/Symptoms: Your throat may feel dry or sore from the anesthesia or the breathing tube placed in your throat during surgery. If this causes discomfort, gargle with warm salt water. The discomfort should disappear within 24 hours.  If you had a scopolamine patch placed behind your ear for the management of post- operative nausea and/or vomiting:  1. The medication in the patch is effective for 72 hours, after which it should be removed.  Wrap patch in a tissue and discard in the trash. Wash hands thoroughly with soap and water. 2. You may remove the patch earlier than 72 hours if you experience unpleasant side effects which may include dry mouth, dizziness or visual disturbances. 3. Avoid touching the patch. Wash your hands with soap and water after contact with the patch.

## 2018-12-27 NOTE — Anesthesia Preprocedure Evaluation (Signed)
Anesthesia Evaluation  Patient identified by MRN, date of birth, ID band Patient awake    Reviewed: Allergy & Precautions, NPO status , Patient's Chart, lab work & pertinent test results  Airway Mallampati: II  TM Distance: >3 FB Neck ROM: Full    Dental no notable dental hx.    Pulmonary neg pulmonary ROS, Current Smoker and Patient abstained from smoking.,    Pulmonary exam normal breath sounds clear to auscultation       Cardiovascular negative cardio ROS Normal cardiovascular exam Rhythm:Regular Rate:Normal     Neuro/Psych  Headaches, Anxiety negative psych ROS   GI/Hepatic negative GI ROS, Neg liver ROS,   Endo/Other  Hypothyroidism   Renal/GU negative Renal ROS  negative genitourinary   Musculoskeletal negative musculoskeletal ROS (+)   Abdominal (+) + obese,   Peds negative pediatric ROS (+)  Hematology negative hematology ROS (+)   Anesthesia Other Findings Breast Cancer  Reproductive/Obstetrics negative OB ROS                             Anesthesia Physical  Anesthesia Plan  ASA: III  Anesthesia Plan: General   Post-op Pain Management:    Induction: Intravenous  PONV Risk Score and Plan: 2 and Ondansetron, Midazolam and Treatment may vary due to age or medical condition  Airway Management Planned: LMA  Additional Equipment:   Intra-op Plan:   Post-operative Plan: Extubation in OR  Informed Consent: I have reviewed the patients History and Physical, chart, labs and discussed the procedure including the risks, benefits and alternatives for the proposed anesthesia with the patient or authorized representative who has indicated his/her understanding and acceptance.     Dental advisory given  Plan Discussed with: CRNA  Anesthesia Plan Comments: (PAT note written by Myra Gianotti, PA-C. )        Anesthesia Quick Evaluation

## 2018-12-27 NOTE — H&P (Signed)
Courtney Williams is an 48 y.o. female.   Chief Complaint: breast cancer HPI: 37 yof with breast cancer high oncotype, needs venous access for chemotherapy  Past Medical History:  Diagnosis Date  . Anxiety   . Breast cancer ([place])    Left Breast  . Family history of CLL (chronic lymphoid leukemia)   . Hypoglycemia   . Hypothyroidism   . Migraines   . Thyroid disease    hypothyroid    Past Surgical History:  Procedure Laterality Date  . BREAST LUMPECTOMY WITH RADIOACTIVE SEED AND SENTINEL LYMPH NODE BIOPSY Left 11/10/2018   Procedure: LEFT BREAST LUMPECTOMY WITH RADIOACTIVE SEED AND LEFT AXILLARY SENTINEL LYMPH NODE BIOPSY;  Surgeon: Rolm Bookbinder, MD;  Location: [facility];  Service: General;  Laterality: Left;  . NO PAST SURGERIES      Family History  Problem Relation Age of Onset  . Thyroid disease Father   . Leukemia Father 25       CLL  . Heart attack Maternal Aunt   . Heart disease Maternal Grandmother   . Breast cancer Neg Hx    Social History:  reports that she has been smoking cigarettes. She has never used smokeless tobacco. She reports current alcohol use of about 20.0 standard drinks of alcohol per week. She reports current drug use. Drug: Marijuana.  Allergies: No Known Allergies  Medications Prior to Admission  Medication Sig Dispense Refill  . ALPRAZolam (XANAX) 0.25 MG tablet Take 1 tablet (0.25 mg total) by mouth 2 (two) times daily as needed for anxiety. 30 tablet 0  . fexofenadine (ALLEGRA) 180 MG tablet Take 180 mg by mouth daily as needed for allergies.     Marland Kitchen levothyroxine (SYNTHROID) 125 MCG tablet TAKE 1 TABLET (125 MCG TOTAL) BY MOUTH DAILY BEFORE BREAKFAST. 30 tablet 12  . dexamethasone (DECADRON) 4 MG tablet Take 1 tablet (4 mg total) by mouth daily. Take 1 tablet day before chemo and 1 tablet day after chemo with food 8 tablet 1  . lidocaine-prilocaine (EMLA) cream Apply to affected area once 30 g 3  . ondansetron (ZOFRAN) 8 MG tablet Take 1 tablet (8 mg  total) by mouth 2 (two) times daily as needed for refractory nausea / vomiting. Start on day 3 after chemo. 30 tablet 1  . prochlorperazine (COMPAZINE) 10 MG tablet Take 1 tablet (10 mg total) by mouth every 6 (six) hours as needed (Nausea or vomiting). 30 tablet 1    Results for orders placed or performed during the hospital encounter of 12/27/18 (from the past 48 hour(s))  Pregnancy, urine POC     Status: None   Collection Time: 12/27/18 10:58 AM  Result Value Ref Range   Preg Test, Ur NEGATIVE NEGATIVE    Comment:        THE SENSITIVITY OF THIS METHODOLOGY IS >24 mIU/mL    No results found.  Review of Systems  All other systems reviewed and are negative.   Blood pressure 116/77, pulse 79, temperature 97.8 F (36.6 C), temperature source Oral, resp. rate 16, height 5' 1.75" (1.568 m), weight 75.9 kg, last menstrual period 11/29/2018, SpO2 100 %. Physical Exam  cv rrr Lungs clear  Assessment/Plan Breast cancer Port placement discussed  Rolm Bookbinder, MD 12/27/2018, 12:02 PM

## 2018-12-28 ENCOUNTER — Other Ambulatory Visit: Payer: Self-pay

## 2018-12-28 ENCOUNTER — Inpatient Hospital Stay: Payer: 59

## 2018-12-28 ENCOUNTER — Other Ambulatory Visit: Payer: Self-pay | Admitting: *Deleted

## 2018-12-28 ENCOUNTER — Encounter: Payer: Self-pay | Admitting: *Deleted

## 2018-12-28 ENCOUNTER — Encounter (HOSPITAL_BASED_OUTPATIENT_CLINIC_OR_DEPARTMENT_OTHER): Payer: Self-pay | Admitting: General Surgery

## 2018-12-28 VITALS — BP 115/85 | HR 66 | Temp 97.8°F | Resp 16

## 2018-12-28 DIAGNOSIS — G43909 Migraine, unspecified, not intractable, without status migrainosus: Secondary | ICD-10-CM | POA: Diagnosis not present

## 2018-12-28 DIAGNOSIS — C50412 Malignant neoplasm of upper-outer quadrant of left female breast: Secondary | ICD-10-CM | POA: Diagnosis present

## 2018-12-28 DIAGNOSIS — M898X9 Other specified disorders of bone, unspecified site: Secondary | ICD-10-CM | POA: Diagnosis not present

## 2018-12-28 DIAGNOSIS — Z170 Estrogen receptor positive status [ER+]: Secondary | ICD-10-CM | POA: Diagnosis not present

## 2018-12-28 DIAGNOSIS — T451X5A Adverse effect of antineoplastic and immunosuppressive drugs, initial encounter: Secondary | ICD-10-CM | POA: Diagnosis not present

## 2018-12-28 DIAGNOSIS — Z79899 Other long term (current) drug therapy: Secondary | ICD-10-CM | POA: Diagnosis not present

## 2018-12-28 DIAGNOSIS — Z5111 Encounter for antineoplastic chemotherapy: Secondary | ICD-10-CM | POA: Diagnosis not present

## 2018-12-28 DIAGNOSIS — Z5189 Encounter for other specified aftercare: Secondary | ICD-10-CM | POA: Diagnosis not present

## 2018-12-28 DIAGNOSIS — R519 Headache, unspecified: Secondary | ICD-10-CM | POA: Diagnosis not present

## 2018-12-28 MED ORDER — SODIUM CHLORIDE 0.9 % IV SOLN: 10.0000 mg | Freq: Once | INTRAVENOUS | Status: DC

## 2018-12-28 MED ORDER — PEGFILGRASTIM 6 MG/0.6ML ~~LOC~~ PSKT
PREFILLED_SYRINGE | SUBCUTANEOUS | Status: AC
  Filled 2018-12-28: qty 0.6

## 2018-12-28 MED ORDER — SODIUM CHLORIDE 0.9 % IV SOLN
75.0000 mg/m2 | Freq: Once | INTRAVENOUS | Status: AC
  Administered 2018-12-28: 140 mg via INTRAVENOUS
  Filled 2018-12-28: qty 14

## 2018-12-28 MED ORDER — PEGFILGRASTIM 6 MG/0.6ML ~~LOC~~ PSKT
6.0000 mg | PREFILLED_SYRINGE | Freq: Once | SUBCUTANEOUS | Status: AC
  Administered 2018-12-28: 6 mg via SUBCUTANEOUS

## 2018-12-28 MED ORDER — PALONOSETRON HCL INJECTION 0.25 MG/5ML
INTRAVENOUS | Status: AC
  Filled 2018-12-28: qty 5

## 2018-12-28 MED ORDER — SODIUM CHLORIDE 0.9% FLUSH
10.0000 mL | INTRAVENOUS | Status: DC | PRN
  Administered 2018-12-28: 10 mL
  Filled 2018-12-28: qty 10

## 2018-12-28 MED ORDER — DEXAMETHASONE SODIUM PHOSPHATE 10 MG/ML IJ SOLN
10.0000 mg | Freq: Once | INTRAMUSCULAR | Status: AC
  Administered 2018-12-28: 10 mg via INTRAVENOUS

## 2018-12-28 MED ORDER — HEPARIN SOD (PORK) LOCK FLUSH 100 UNIT/ML IV SOLN
500.0000 [IU] | Freq: Once | INTRAVENOUS | Status: AC | PRN
  Administered 2018-12-28: 500 [IU]
  Filled 2018-12-28: qty 5

## 2018-12-28 MED ORDER — SODIUM CHLORIDE 0.9 % IV SOLN
Freq: Once | INTRAVENOUS | Status: AC
  Administered 2018-12-28: 08:00:00 via INTRAVENOUS
  Filled 2018-12-28: qty 250

## 2018-12-28 MED ORDER — SODIUM CHLORIDE 0.9 % IV SOLN
600.0000 mg/m2 | Freq: Once | INTRAVENOUS | Status: AC
  Administered 2018-12-28: 1100 mg via INTRAVENOUS
  Filled 2018-12-28: qty 55

## 2018-12-28 MED ORDER — DEXAMETHASONE SODIUM PHOSPHATE 10 MG/ML IJ SOLN
INTRAMUSCULAR | Status: AC
  Filled 2018-12-28: qty 1

## 2018-12-28 MED ORDER — PALONOSETRON HCL INJECTION 0.25 MG/5ML
0.2500 mg | Freq: Once | INTRAVENOUS | Status: AC
  Administered 2018-12-28: 0.25 mg via INTRAVENOUS

## 2018-12-28 NOTE — Progress Notes (Signed)
Patient prefers [employer] (insurance requires [employer] brand). Orders updated to reflect this and scheduling message sent to cancel injection appts.   Demetrius Charity, PharmD, [place] [facility] Pharmacist Pharmacy Phone: [phone] 12/28/2018

## 2018-12-28 NOTE — Patient Instructions (Signed)
[facility] Discharge Instructions for Patients Receiving Chemotherapy  Today you received the following chemotherapy agents: Docetaxel (Taxotere); Cyclophosphamide (Cytoxan).    To help prevent nausea and vomiting after your treatment, we encourage you to take your nausea medication as directed.   If you develop nausea and vomiting that is not controlled by your nausea medication, call the clinic.   BELOW ARE SYMPTOMS THAT SHOULD BE REPORTED IMMEDIATELY:  *FEVER GREATER THAN 100.5 F  *CHILLS WITH OR WITHOUT FEVER  NAUSEA AND VOMITING THAT IS NOT CONTROLLED WITH YOUR NAUSEA MEDICATION  *UNUSUAL SHORTNESS OF BREATH  *UNUSUAL BRUISING OR BLEEDING  TENDERNESS IN MOUTH AND THROAT WITH OR WITHOUT PRESENCE OF ULCERS  *URINARY PROBLEMS  *BOWEL PROBLEMS  UNUSUAL RASH Items with * indicate a potential emergency and should be followed up as soon as possible.  Feel free to call the clinic should you have any questions or concerns. The clinic phone number is [phone].  Please show the [place] at check-in to the Emergency Department and triage nurse.  Docetaxel injection What is this medicine? DOCETAXEL (doe se TAX el) is a chemotherapy drug. It targets fast dividing cells, like cancer cells, and causes these cells to die. This medicine is used to treat many types of cancers like breast cancer, certain stomach cancers, head and neck cancer, lung cancer, and prostate cancer. This medicine may be used for other purposes; ask your health care provider or pharmacist if you have questions. COMMON BRAND NAME(S): Docefrez, Taxotere What should I tell my health care provider before I take this medicine? They need to know if you have any of these conditions:  infection (especially a virus infection such as chickenpox, cold sores, or herpes)  liver disease  low blood counts, like low white cell, platelet, or red cell counts  an unusual or allergic reaction to  docetaxel, polysorbate 80, other chemotherapy agents, other medicines, foods, dyes, or preservatives  pregnant or trying to get pregnant  breast-feeding How should I use this medicine? This drug is given as an infusion into a vein. It is administered in a hospital or clinic by a specially trained health care professional. Talk to your pediatrician regarding the use of this medicine in children. Special care may be needed. Overdosage: If you think you have taken too much of this medicine contact a poison control center or emergency room at once. NOTE: This medicine is only for you. Do not share this medicine with others. What if I miss a dose? It is important not to miss your dose. Call your doctor or health care professional if you are unable to keep an appointment. What may interact with this medicine?  aprepitant  certain antibiotics like erythromycin or clarithromycin  certain antivirals for HIV or hepatitis  certain medicines for fungal infections like fluconazole, itraconazole, ketoconazole, posaconazole, or voriconazole  cimetidine  ciprofloxacin  conivaptan  cyclosporine  dronedarone  fluvoxamine  grapefruit juice  imatinib  verapamil This list may not describe all possible interactions. Give your health care provider a list of all the medicines, herbs, non-prescription drugs, or dietary supplements you use. Also tell them if you smoke, drink alcohol, or use illegal drugs. Some items may interact with your medicine. What should I watch for while using this medicine? Your condition will be monitored carefully while you are receiving this medicine. You will need important blood work done while you are taking this medicine. Call your doctor or health care professional for advice if you  get a fever, chills or sore throat, or other symptoms of a cold or flu. Do not treat yourself. This drug decreases your body's ability to fight infections. Try to avoid being around people  who are sick. Some products may contain alcohol. Ask your health care professional if this medicine contains alcohol. Be sure to tell all health care professionals you are taking this medicine. Certain medicines, like metronidazole and disulfiram, can cause an unpleasant reaction when taken with alcohol. The reaction includes flushing, headache, nausea, vomiting, sweating, and increased thirst. The reaction can last from 30 minutes to several hours. You may get drowsy or dizzy. Do not drive, use machinery, or do anything that needs mental alertness until you know how this medicine affects you. Do not stand or sit up quickly, especially if you are an older patient. This reduces the risk of dizzy or fainting spells. Alcohol may interfere with the effect of this medicine. Talk to your health care professional about your risk of cancer. You may be more at risk for certain types of cancer if you take this medicine. Do not become pregnant while taking this medicine or for 6 months after stopping it. Women should inform their doctor if they wish to become pregnant or think they might be pregnant. There is a potential for serious side effects to an unborn child. Talk to your health care professional or pharmacist for more information. Do not breast-feed an infant while taking this medicine or for 1 to 2 weeks after stopping it. Males who get this medicine must use a condom during sex with females who can get pregnant. If you get a woman pregnant, the baby could have birth defects. The baby could die before they are born. You will need to continue wearing a condom for 3 months after stopping the medicine. Tell your health care provider right away if your partner becomes pregnant while you are taking this medicine. This may interfere with the ability to father a child. You should talk to your doctor or health care professional if you are concerned about your fertility. What side effects may I notice from receiving this  medicine? Side effects that you should report to your doctor or health care professional as soon as possible:  allergic reactions like skin rash, itching or hives, swelling of the face, lips, or tongue  blurred vision  breathing problems  changes in vision  low blood counts - This drug may decrease the number of white blood cells, red blood cells and platelets. You may be at increased risk for infections and bleeding.  nausea and vomiting  pain, redness or irritation at site where injected  pain, tingling, numbness in the hands or feet  redness, blistering, peeling, or loosening of the skin, including inside the mouth  signs of decreased platelets or bleeding - bruising, pinpoint red spots on the skin, black, tarry stools, nosebleeds  signs of decreased red blood cells - unusually weak or tired, fainting spells, lightheadedness  signs of infection - fever or chills, cough, sore throat, pain or difficulty passing urine  swelling of the ankle, feet, hands Side effects that usually do not require medical attention (report to your doctor or health care professional if they continue or are bothersome):  constipation  diarrhea  fingernail or toenail changes  hair loss  loss of appetite  mouth sores  muscle pain This list may not describe all possible side effects. Call your doctor for medical advice about side effects. You may report side  effects to FDA at 1-800-FDA-1088. Where should I keep my medicine? This drug is given in a hospital or clinic and will not be stored at home. NOTE: This sheet is a summary. It may not cover all possible information. If you have questions about this medicine, talk to your doctor, pharmacist, or health care provider.  2020 Elsevier/Gold Standard (2018-03-12 12:23:11)   Cyclophosphamide injection What is this medicine? CYCLOPHOSPHAMIDE (sye kloe FOSS fa mide) is a chemotherapy drug. It slows the growth of cancer cells. This medicine is  used to treat many types of cancer like lymphoma, myeloma, leukemia, breast cancer, and ovarian cancer, to name a few. This medicine may be used for other purposes; ask your health care provider or pharmacist if you have questions. COMMON BRAND NAME(S): Cytoxan, Neosar What should I tell my health care provider before I take this medicine? They need to know if you have any of these conditions:  blood disorders  history of other chemotherapy  infection  kidney disease  liver disease  recent or ongoing radiation therapy  tumors in the bone marrow  an unusual or allergic reaction to cyclophosphamide, other chemotherapy, other medicines, foods, dyes, or preservatives  pregnant or trying to get pregnant  breast-feeding How should I use this medicine? This drug is usually given as an injection into a vein or muscle or by infusion into a vein. It is administered in a hospital or clinic by a specially trained health care professional. Talk to your pediatrician regarding the use of this medicine in children. Special care may be needed. Overdosage: If you think you have taken too much of this medicine contact a poison control center or emergency room at once. NOTE: This medicine is only for you. Do not share this medicine with others. What if I miss a dose? It is important not to miss your dose. Call your doctor or health care professional if you are unable to keep an appointment. What may interact with this medicine? This medicine may interact with the following medications:  amiodarone  amphotericin B  azathioprine  certain antiviral medicines for HIV or AIDS such as protease inhibitors (e.g., indinavir, ritonavir) and zidovudine  certain blood pressure medications such as benazepril, captopril, enalapril, fosinopril, lisinopril, moexipril, monopril, perindopril, quinapril, ramipril, trandolapril  certain cancer medications such as anthracyclines (e.g., daunorubicin,  doxorubicin), busulfan, cytarabine, paclitaxel, pentostatin, tamoxifen, trastuzumab  certain diuretics such as chlorothiazide, chlorthalidone, hydrochlorothiazide, indapamide, metolazone  certain medicines that treat or prevent blood clots like warfarin  certain muscle relaxants such as succinylcholine  cyclosporine  etanercept  indomethacin  medicines to increase blood counts like filgrastim, pegfilgrastim, sargramostim  medicines used as general anesthesia  metronidazole  natalizumab This list may not describe all possible interactions. Give your health care provider a list of all the medicines, herbs, non-prescription drugs, or dietary supplements you use. Also tell them if you smoke, drink alcohol, or use illegal drugs. Some items may interact with your medicine. What should I watch for while using this medicine? Visit your doctor for checks on your progress. This drug may make you feel generally unwell. This is not uncommon, as chemotherapy can affect healthy cells as well as cancer cells. Report any side effects. Continue your course of treatment even though you feel ill unless your doctor tells you to stop. Drink water or other fluids as directed. Urinate often, even at night. In some cases, you may be given additional medicines to help with side effects. Follow all directions for their use.  Call your doctor or health care professional for advice if you get a fever, chills or sore throat, or other symptoms of a cold or flu. Do not treat yourself. This drug decreases your body's ability to fight infections. Try to avoid being around people who are sick. This medicine may increase your risk to bruise or bleed. Call your doctor or health care professional if you notice any unusual bleeding. Be careful brushing and flossing your teeth or using a toothpick because you may get an infection or bleed more easily. If you have any dental work done, tell your dentist you are receiving this  medicine. You may get drowsy or dizzy. Do not drive, use machinery, or do anything that needs mental alertness until you know how this medicine affects you. Do not become pregnant while taking this medicine or for 1 year after stopping it. Women should inform their doctor if they wish to become pregnant or think they might be pregnant. Men should not father a child while taking this medicine and for 4 months after stopping it. There is a potential for serious side effects to an unborn child. Talk to your health care professional or pharmacist for more information. Do not breast-feed an infant while taking this medicine. This medicine may interfere with the ability to have a child. This medicine has caused ovarian failure in some women. This medicine has caused reduced sperm counts in some men. You should talk with your doctor or health care professional if you are concerned about your fertility. If you are going to have surgery, tell your doctor or health care professional that you have taken this medicine. What side effects may I notice from receiving this medicine? Side effects that you should report to your doctor or health care professional as soon as possible:  allergic reactions like skin rash, itching or hives, swelling of the face, lips, or tongue  low blood counts - this medicine may decrease the number of white blood cells, red blood cells and platelets. You may be at increased risk for infections and bleeding.  signs of infection - fever or chills, cough, sore throat, pain or difficulty passing urine  signs of decreased platelets or bleeding - bruising, pinpoint red spots on the skin, black, tarry stools, blood in the urine  signs of decreased red blood cells - unusually weak or tired, fainting spells, lightheadedness  breathing problems  dark urine  dizziness  palpitations  swelling of the ankles, feet, hands  trouble passing urine or change in the amount of urine  weight  gain  yellowing of the eyes or skin Side effects that usually do not require medical attention (report to your doctor or health care professional if they continue or are bothersome):  changes in nail or skin color  hair loss  missed menstrual periods  mouth sores  nausea, vomiting This list may not describe all possible side effects. Call your doctor for medical advice about side effects. You may report side effects to FDA at 1-800-FDA-1088. Where should I keep my medicine? This drug is given in a hospital or clinic and will not be stored at home. NOTE: This sheet is a summary. It may not cover all possible information. If you have questions about this medicine, talk to your doctor, pharmacist, or health care provider.  2020 Elsevier/Gold Standard (2011-11-21 16:22:58)

## 2018-12-29 ENCOUNTER — Telehealth: Payer: Self-pay | Admitting: *Deleted

## 2018-12-30 ENCOUNTER — Inpatient Hospital Stay: Payer: 59

## 2019-01-03 NOTE — Progress Notes (Signed)
Patient Care Team: Patient, [name] as PCP - General (General Practice) Rockwell Germany, RN as Oncology Nurse Navigator Mauro Kaufmann, RN as Oncology Nurse Navigator Rolm Bookbinder, MD as Consulting Physician (General Surgery) Nicholas Lose, MD as Consulting Physician (Hematology and Oncology)  DIAGNOSIS:    ICD-10-CM   1. Malignant neoplasm of upper-outer quadrant of left breast in female, estrogen receptor positive ([place])  C50.412    Z17.0     SUMMARY OF ONCOLOGIC HISTORY: Oncology History  Malignant neoplasm of upper-outer quadrant of left breast in female, estrogen receptor positive ([place])  10/22/2018 Initial Diagnosis   Routine screening mammogram showed a 1.4cm mass in the left breast at the 2:00 position with no left axillary adenopathy. Biopsy showed IDC, grade 2, HER-2 equivocal (2+), ER+ 80%, PR+ 10%, Ki67 15%.   10/27/2018 Cancer Staging   Staging form: Breast, AJCC 8th Edition - Clinical stage from 10/27/2018: Stage IA (cT1c, cN0, cM0, G2, ER+, PR+, HER2-) - Signed by Nicholas Lose, MD on 10/27/2018   11/05/2018 Genetic Testing   Negative genetic testing: no pathogenic variants detected on the [facility] Breast Cancer STAT panel and Common Hereditary Cancers Panel. The report date is 11/05/2018.  The Breast Cancer STAT panel offered by [facility] includes sequencing and rearrangement analysis for the following 9 genes:  ATM, BRCA1, BRCA2, CDH1, CHEK2, PALB2, PTEN, STK11 and TP53.  The Common Hereditary Gene Panel offered by [facility] includes sequencing and/or deletion duplication testing of the following 48 genes: APC, ATM, AXIN2, BARD1, BMPR1A, BRCA1, BRCA2, BRIP1, CDH1, CDK4, CDKN2A (p14ARF), CDKN2A (p16INK4a), CHEK2, CTNNA1, DICER1, EPCAM (Deletion/duplication testing only), GREM1 (promoter region deletion/duplication testing only), KIT, MEN1, MLH1, MSH2, MSH3, MSH6, MUTYH, NBN, NF1, NHTL1, PALB2, PDGFRA, PMS2, POLD1, POLE, PTEN, RAD50, RAD51C, RAD51D, RNF43, SDHB, SDHC,  SDHD, SMAD4, SMARCA4. STK11, TP53, TSC1, TSC2, and VHL.  The following genes were evaluated for sequence changes only: SDHA and HOXB13 c.251G>A variant only.   11/10/2018 Surgery   Left lumpectomy [name]): IDC, grade 2, 1.4cm, clear margins, and micrometastasis in 1 left axillary lymph node.   11/30/2018 Oncotype testing   Score of 22, 18% chance of distant recurrence in 9 years without systemic treatment.   12/28/2018 -  Chemotherapy   The patient had palonosetron (ALOXI) injection 0.25 mg, 0.25 mg, Intravenous,  Once, 1 of 4 cycles Administration: 0.25 mg (12/28/2018) pegfilgrastim (NEULASTA ONPRO KIT) injection 6 mg, 6 mg, Subcutaneous, Once, 1 of 4 cycles Administration: 6 mg (12/28/2018) cyclophosphamide (CYTOXAN) 1,100 mg in sodium chloride 0.9 % 250 mL chemo infusion, 600 mg/m2 = 1,100 mg, Intravenous,  Once, 1 of 4 cycles Administration: 1,100 mg (12/28/2018) DOCEtaxel (TAXOTERE) 140 mg in sodium chloride 0.9 % 250 mL chemo infusion, 75 mg/m2 = 140 mg, Intravenous,  Once, 1 of 4 cycles Administration: 140 mg (12/28/2018)  for chemotherapy treatment.      CHIEF COMPLIANT: Cycle 1 Day 8 Taxotere and Cytoxan  INTERVAL HISTORY: Courtney Williams is a 48 y.o. with above-mentioned history of left breast cancer who underwent a lumpectomy. She is currently on adjuvant chemotherapy with Taxotere and Cytoxan. Her port was placed by Dr. [name] on 12/27/18. She presents to the clinic today for a toxicity check following cycle 1.   REVIEW OF SYSTEMS:   Constitutional: Denies fevers, chills or abnormal weight loss Eyes: Denies blurriness of vision Ears, nose, mouth, throat, and face: Denies mucositis or sore throat Respiratory: Denies cough, dyspnea or wheezes Cardiovascular: Denies palpitation, chest discomfort Gastrointestinal: Denies nausea, heartburn or  change in bowel habits Skin: Denies abnormal skin rashes Lymphatics: Denies new lymphadenopathy or easy bruising Neurological: Denies  numbness, tingling or new weaknesses Behavioral/Psych: Mood is stable, no new changes  Extremities: No lower extremity edema Breast: denies any pain or lumps or nodules in either breasts All other systems were reviewed with the patient and are negative.  I have reviewed the past medical history, past surgical history, social history and family history with the patient and they are unchanged from previous note.  ALLERGIES:  has No Known Allergies.  MEDICATIONS:  Current Outpatient Medications  Medication Sig Dispense Refill  . ALPRAZolam (XANAX) 0.25 MG tablet Take 1 tablet (0.25 mg total) by mouth 2 (two) times daily as needed for anxiety. 30 tablet 0  . dexamethasone (DECADRON) 4 MG tablet Take 1 tablet (4 mg total) by mouth daily. Take 1 tablet day before chemo and 1 tablet day after chemo with food 8 tablet 1  . fexofenadine (ALLEGRA) 180 MG tablet Take 180 mg by mouth daily as needed for allergies.     Marland Kitchen levothyroxine (SYNTHROID) 125 MCG tablet TAKE 1 TABLET (125 MCG TOTAL) BY MOUTH DAILY BEFORE BREAKFAST. 30 tablet 12  . lidocaine-prilocaine (EMLA) cream Apply to affected area once 30 g 3  . ondansetron (ZOFRAN) 8 MG tablet Take 1 tablet (8 mg total) by mouth 2 (two) times daily as needed for refractory nausea / vomiting. Start on day 3 after chemo. 30 tablet 1  . oxyCODONE (OXY IR/ROXICODONE) 5 MG immediate release tablet Take 1 tablet (5 mg total) by mouth every 6 (six) hours as needed for moderate pain, severe pain or breakthrough pain. 6 tablet 0  . prochlorperazine (COMPAZINE) 10 MG tablet Take 1 tablet (10 mg total) by mouth every 6 (six) hours as needed (Nausea or vomiting). 30 tablet 1   No current facility-administered medications for this visit.    PHYSICAL EXAMINATION: ECOG PERFORMANCE STATUS: 1 - Symptomatic but completely ambulatory  Vitals:   01/04/19 1130  BP: 127/86  Pulse: 98  Resp: 18  Temp: 98.9 F (37.2 C)  SpO2: 100%   Filed Weights   01/04/19 1130    Weight: 169 lb 8 oz (76.9 kg)    GENERAL: alert, no distress and comfortable SKIN: skin color, texture, turgor are normal, no rashes or significant lesions EYES: normal, Conjunctiva are pink and non-injected, sclera clear OROPHARYNX: no exudate, no erythema and lips, buccal mucosa, and tongue normal  NECK: supple, thyroid normal size, non-tender, without nodularity LYMPH: no palpable lymphadenopathy in the cervical, axillary or inguinal LUNGS: clear to auscultation and percussion with normal breathing effort HEART: regular rate & rhythm and no murmurs and no lower extremity edema ABDOMEN: abdomen soft, non-tender and normal bowel sounds MUSCULOSKELETAL: no cyanosis of digits and no clubbing  NEURO: alert & oriented x 3 with fluent speech, no focal motor/sensory deficits EXTREMITIES: No lower extremity edema  LABORATORY DATA:  I have reviewed the data as listed CMP Latest Ref Rng & Units 12/27/2018 11/04/2018 10/27/2018  Glucose 70 - 99 mg/dL 97 86 89  BUN 6 - 20 mg/dL _0 Creatinine 0.44 - 1.00 mg/dL 0.59 0.58 0.77  Sodium 135 - 145 mmol/L 142 140 140  Potassium 3.5 - 5.1 mmol/L 3.1(L) 3.5 3.8  Chloride 98 - 111 mmol/L 110 104 104  CO2 22 - 32 mmol/L _1 Calcium 8.9 - 10.3 mg/dL 7.2(L) 9.0 9.3  Total Protein 6.5 - 8.1 g/dL 4.8(L) 6.6 6.9  Total Bilirubin 0.3 - 1.2 mg/dL 0.3 0.9 0.3  Alkaline Phos 38 - 126 U/L 30(L) 42 46  AST 15 - 41 U/L 9(L) 17 13(L)  ALT 0 - 44 U/L _0 Lab Results  Component Value Date   WBC 7.9 12/27/2018   HGB 11.2 (L) 12/27/2018   HCT 34.3 (L) 12/27/2018   MCV 93.7 12/27/2018   PLT 310 12/27/2018   NEUTROABS 5.1 12/27/2018    ASSESSMENT & PLAN:  Malignant neoplasm of upper-outer quadrant of left breast in female, estrogen receptor positive ([place]) 10/22/18:Routine screening mammogram showed a 1.4cm mass in the left breast at the 2:00 position with no left axillary adenopathy. Biopsy showed IDC, grade 2, HER-2 equivocal (2+), ER+ 80%,  PR+ 10%, Ki6715%.  11/10/2018:Left lumpectomy [name]): IDC, grade 2, 1.4cm, clear margins, and micrometastasis in 1 left axillary lymph node.ER 80%, PR 10%, Ki-67 15%, HER-2 equivocal 2+ by IHC but FISH negative  Oncotype DX score: 22 distant recurrence at 9 years with hormone therapy alone 18%  Recommendation:  1. Based upon tailor Rx data, I recommended systemic chemotherapy with Taxotere and Cytoxan every 3 weeks x4 cycles 2. Adj XRT 3. Adj Anti-estrogen therapy ----------------------------------------------------------------------------------------------------------------------------------------------------------- Current Treatment: Cycle 1 day 8 Taxotere Cytoxan Chemo toxicities: 1.  Intermittent headaches 2.  Joint discomfort especially in the hips probably due to growth factor injection  Labs are reviewed RTC in 2 weeks for cycle 2    No orders of the defined types were placed in this encounter.  The patient has a good understanding of the overall plan. she agrees with it. she will call with any problems that may develop before the next visit here.  Nicholas Lose, MD 01/04/2019  Julious Oka Dorshimer, am acting as scribe for Dr. Nicholas Lose.  I have reviewed the above document for accuracy and completeness, and I agree with the above.

## 2019-01-04 ENCOUNTER — Other Ambulatory Visit: Payer: Self-pay

## 2019-01-04 ENCOUNTER — Inpatient Hospital Stay: Payer: 59

## 2019-01-04 ENCOUNTER — Inpatient Hospital Stay (HOSPITAL_BASED_OUTPATIENT_CLINIC_OR_DEPARTMENT_OTHER): Payer: 59 | Admitting: Hematology and Oncology

## 2019-01-04 DIAGNOSIS — C50412 Malignant neoplasm of upper-outer quadrant of left female breast: Secondary | ICD-10-CM

## 2019-01-04 DIAGNOSIS — Z170 Estrogen receptor positive status [ER+]: Secondary | ICD-10-CM | POA: Diagnosis not present

## 2019-01-04 DIAGNOSIS — Z5111 Encounter for antineoplastic chemotherapy: Secondary | ICD-10-CM | POA: Diagnosis not present

## 2019-01-04 DIAGNOSIS — Z95828 Presence of other vascular implants and grafts: Secondary | ICD-10-CM

## 2019-01-04 LAB — CBC WITH DIFFERENTIAL (CANCER CENTER ONLY)
Abs Immature Granulocytes: 3.35 10*3/uL — ABNORMAL HIGH (ref 0.00–0.07)
Basophils Absolute: 0 10*3/uL (ref 0.0–0.1)
Basophils Relative: 0 %
Eosinophils Absolute: 0.1 10*3/uL (ref 0.0–0.5)
Eosinophils Relative: 1 %
HCT: 38.7 % (ref 36.0–46.0)
Hemoglobin: 12.7 g/dL (ref 12.0–15.0)
Immature Granulocytes: 18 %
Lymphocytes Relative: 12 %
Lymphs Abs: 2.4 10*3/uL (ref 0.7–4.0)
MCH: 29.8 pg (ref 26.0–34.0)
MCHC: 32.8 g/dL (ref 30.0–36.0)
MCV: 90.8 fL (ref 80.0–100.0)
Monocytes Absolute: 3.3 10*3/uL — ABNORMAL HIGH (ref 0.1–1.0)
Monocytes Relative: 18 %
Neutro Abs: 9.7 10*3/uL — ABNORMAL HIGH (ref 1.7–7.7)
Neutrophils Relative %: 51 %
Platelet Count: 323 10*3/uL (ref 150–400)
RBC: 4.26 MIL/uL (ref 3.87–5.11)
RDW: 12.7 % (ref 11.5–15.5)
Smear Review: INCREASED
WBC Count: 18.9 10*3/uL — ABNORMAL HIGH (ref 4.0–10.5)
nRBC: 0.3 % — ABNORMAL HIGH (ref 0.0–0.2)

## 2019-01-04 LAB — CMP (CANCER CENTER ONLY)
ALT: 87 U/L — ABNORMAL HIGH (ref 0–44)
AST: 34 U/L (ref 15–41)
Albumin: 3.7 g/dL (ref 3.5–5.0)
Alkaline Phosphatase: 59 U/L (ref 38–126)
Anion gap: 10 (ref 5–15)
BUN: 6 mg/dL (ref 6–20)
CO2: 27 mmol/L (ref 22–32)
Calcium: 9 mg/dL (ref 8.9–10.3)
Chloride: 103 mmol/L (ref 98–111)
Creatinine: 0.7 mg/dL (ref 0.44–1.00)
GFR, Est AFR Am: >60 mL/min (ref >60)
GFR, Estimated: >60 mL/min (ref >60)
Glucose, Bld: 117 mg/dL — ABNORMAL HIGH (ref 70–99)
Potassium: 3.5 mmol/L (ref 3.5–5.1)
Sodium: 140 mmol/L (ref 135–145)
Total Bilirubin: 0.3 mg/dL (ref 0.3–1.2)
Total Protein: 6.8 g/dL (ref 6.5–8.1)

## 2019-01-04 MED ORDER — HEPARIN SOD (PORK) LOCK FLUSH 100 UNIT/ML IV SOLN
500.0000 [IU] | Freq: Once | INTRAVENOUS | Status: AC
  Administered 2019-01-04: 500 [IU] via INTRAVENOUS
  Filled 2019-01-04: qty 5

## 2019-01-04 MED ORDER — SODIUM CHLORIDE 0.9% FLUSH
10.0000 mL | INTRAVENOUS | Status: DC | PRN
  Administered 2019-01-04: 10 mL via INTRAVENOUS
  Filled 2019-01-04: qty 10

## 2019-01-04 NOTE — Assessment & Plan Note (Addendum)
10/22/18:Routine screening mammogram showed a 1.4cm mass in the left breast at the 2:00 position with no left axillary adenopathy. Biopsy showed IDC, grade 2, HER-2 equivocal (2+), ER+ 80%, PR+ 10%, Ki6715%.  11/10/2018:Left lumpectomy Courtney Williams): IDC, grade 2, 1.4cm, clear margins, and micrometastasis in 1 left axillary lymph node.ER 80%, PR 10%, Ki-67 15%, HER-2 equivocal 2+ by IHC but FISH negative  Oncotype DX score: 22 distant recurrence at 9 years with hormone therapy alone 18%  Recommendation:  1. Based upon tailor Rx data, I recommended systemic chemotherapy with Taxotere and Cytoxan every 3 weeks x4 cycles 2. Adj XRT 3. Adj Anti-estrogen therapy ----------------------------------------------------------------------------------------------------------------------------------------------------------- Current Treatment: Cycle 1 Taxotere Cytoxan Labs reviewed Anti-emetics discussed Chemo education completed, Chemo consent obtained  RTC in 1 week for toxicity check

## 2019-01-17 NOTE — Progress Notes (Signed)
Patient Care Team: Patient, [name] as PCP - General (General Practice) Rockwell Germany, RN as Oncology Nurse Navigator Mauro Kaufmann, RN as Oncology Nurse Navigator Rolm Bookbinder, MD as Consulting Physician (General Surgery) Nicholas Lose, MD as Consulting Physician (Hematology and Oncology)  DIAGNOSIS:    ICD-10-CM   1. Malignant neoplasm of upper-outer quadrant of left breast in female, estrogen receptor positive ([place])  C50.412    Z17.0     SUMMARY OF ONCOLOGIC HISTORY: Oncology History  Malignant neoplasm of upper-outer quadrant of left breast in female, estrogen receptor positive ([place])  10/22/2018 Initial Diagnosis   Routine screening mammogram showed a 1.4cm mass in the left breast at the 2:00 position with no left axillary adenopathy. Biopsy showed IDC, grade 2, HER-2 equivocal (2+), ER+ 80%, PR+ 10%, Ki67 15%.   10/27/2018 Cancer Staging   Staging form: Breast, AJCC 8th Edition - Clinical stage from 10/27/2018: Stage IA (cT1c, cN0, cM0, G2, ER+, PR+, HER2-) - Signed by Nicholas Lose, MD on 10/27/2018   11/05/2018 Genetic Testing   Negative genetic testing: no pathogenic variants detected on the [facility] Breast Cancer STAT panel and Common Hereditary Cancers Panel. The report date is 11/05/2018.  The Breast Cancer STAT panel offered by [facility] includes sequencing and rearrangement analysis for the following 9 genes:  ATM, BRCA1, BRCA2, CDH1, CHEK2, PALB2, PTEN, STK11 and TP53.  The Common Hereditary Gene Panel offered by [facility] includes sequencing and/or deletion duplication testing of the following 48 genes: APC, ATM, AXIN2, BARD1, BMPR1A, BRCA1, BRCA2, BRIP1, CDH1, CDK4, CDKN2A (p14ARF), CDKN2A (p16INK4a), CHEK2, CTNNA1, DICER1, EPCAM (Deletion/duplication testing only), GREM1 (promoter region deletion/duplication testing only), KIT, MEN1, MLH1, MSH2, MSH3, MSH6, MUTYH, NBN, NF1, NHTL1, PALB2, PDGFRA, PMS2, POLD1, POLE, PTEN, RAD50, RAD51C, RAD51D, RNF43, SDHB, SDHC,  SDHD, SMAD4, SMARCA4. STK11, TP53, TSC1, TSC2, and VHL.  The following genes were evaluated for sequence changes only: SDHA and HOXB13 c.251G>A variant only.   11/10/2018 Surgery   Left lumpectomy [name]): IDC, grade 2, 1.4cm, clear margins, and micrometastasis in 1 left axillary lymph node.   11/30/2018 Oncotype testing   Score of 22, 18% chance of distant recurrence in 9 years without systemic treatment.   12/28/2018 -  Chemotherapy   The patient had palonosetron (ALOXI) injection 0.25 mg, 0.25 mg, Intravenous,  Once, 1 of 4 cycles Administration: 0.25 mg (12/28/2018) pegfilgrastim (NEULASTA ONPRO KIT) injection 6 mg, 6 mg, Subcutaneous, Once, 1 of 4 cycles Administration: 6 mg (12/28/2018) cyclophosphamide (CYTOXAN) 1,100 mg in sodium chloride 0.9 % 250 mL chemo infusion, 600 mg/m2 = 1,100 mg, Intravenous,  Once, 1 of 4 cycles Administration: 1,100 mg (12/28/2018) DOCEtaxel (TAXOTERE) 140 mg in sodium chloride 0.9 % 250 mL chemo infusion, 75 mg/m2 = 140 mg, Intravenous,  Once, 1 of 4 cycles Administration: 140 mg (12/28/2018)  for chemotherapy treatment.      CHIEF COMPLIANT: Cycle 2 Taxotere and Cytoxan  INTERVAL HISTORY: Courtney Williams is a 48 y.o. with above-mentioned history of left breast cancer who underwent a lumpectomy. She is currently on adjuvant chemotherapy with Taxotere and Cytoxan. She presents to the clinic today for a toxicity check and cycle 2.   Other than migraine headaches and bone pains related to the growth factor injection she has done extremely well from treatment standpoint.  Denies any nausea or vomiting.  Denies any diarrhea or constipation.  REVIEW OF SYSTEMS:   Constitutional: Denies fevers, chills or abnormal weight loss Eyes: Denies blurriness of vision Ears, nose, mouth, throat,  and face: Denies mucositis or sore throat Respiratory: Denies cough, dyspnea or wheezes Cardiovascular: Denies palpitation, chest discomfort Gastrointestinal: Denies nausea,  heartburn or change in bowel habits Skin: Denies abnormal skin rashes Lymphatics: Denies new lymphadenopathy or easy bruising Neurological: Denies numbness, tingling or new weaknesses Behavioral/Psych: Mood is stable, no new changes  Extremities: No lower extremity edema Breast: denies any pain or lumps or nodules in either breasts All other systems were reviewed with the patient and are negative.  I have reviewed the past medical history, past surgical history, social history and family history with the patient and they are unchanged from previous note.  ALLERGIES:  has No Known Allergies.  MEDICATIONS:  Current Outpatient Medications  Medication Sig Dispense Refill  . ALPRAZolam (XANAX) 0.25 MG tablet Take 1 tablet (0.25 mg total) by mouth 2 (two) times daily as needed for anxiety. 30 tablet 1  . dexamethasone (DECADRON) 4 MG tablet Take 1 tablet (4 mg total) by mouth daily. Take 1 tablet day before chemo and 1 tablet day after chemo with food 8 tablet 1  . fexofenadine (ALLEGRA) 180 MG tablet Take 180 mg by mouth daily as needed for allergies.     Marland Kitchen levothyroxine (SYNTHROID) 125 MCG tablet TAKE 1 TABLET (125 MCG TOTAL) BY MOUTH DAILY BEFORE BREAKFAST. 30 tablet 12  . lidocaine-prilocaine (EMLA) cream Apply to affected area once 30 g 3  . ondansetron (ZOFRAN) 8 MG tablet Take 1 tablet (8 mg total) by mouth 2 (two) times daily as needed for refractory nausea / vomiting. Start on day 3 after chemo. 30 tablet 1  . oxyCODONE (OXY IR/ROXICODONE) 5 MG immediate release tablet Take 1 tablet (5 mg total) by mouth every 6 (six) hours as needed for moderate pain, severe pain or breakthrough pain. 6 tablet 0  . prochlorperazine (COMPAZINE) 10 MG tablet Take 1 tablet (10 mg total) by mouth every 6 (six) hours as needed (Nausea or vomiting). 30 tablet 1  . rizatriptan (MAXALT) 10 MG tablet Take 1 tablet (10 mg total) by mouth as needed for migraine. May repeat in 2 hours if needed 10 tablet 0   No  current facility-administered medications for this visit.    PHYSICAL EXAMINATION: ECOG PERFORMANCE STATUS: 1 - Symptomatic but completely ambulatory  Vitals:   01/18/19 0824  BP: 115/85  Pulse: 82  Resp: 18  Temp: 99.1 F (37.3 C)  SpO2: 99%   Filed Weights   01/18/19 0824  Weight: 171 lb 6.4 oz (77.7 kg)    GENERAL: alert, no distress and comfortable SKIN: skin color, texture, turgor are normal, no rashes or significant lesions EYES: normal, Conjunctiva are pink and non-injected, sclera clear OROPHARYNX: no exudate, no erythema and lips, buccal mucosa, and tongue normal  NECK: supple, thyroid normal size, non-tender, without nodularity LYMPH: no palpable lymphadenopathy in the cervical, axillary or inguinal LUNGS: clear to auscultation and percussion with normal breathing effort HEART: regular rate & rhythm and no murmurs and no lower extremity edema ABDOMEN: abdomen soft, non-tender and normal bowel sounds MUSCULOSKELETAL: no cyanosis of digits and no clubbing  NEURO: alert & oriented x 3 with fluent speech, no focal motor/sensory deficits EXTREMITIES: No lower extremity edema  LABORATORY DATA:  I have reviewed the data as listed CMP Latest Ref Rng & Units 01/04/2019 12/27/2018 11/04/2018  Glucose 70 - 99 mg/dL 117(H) 97 86  BUN 6 - 20 mg/dL '6 8 9  '$ Creatinine 0.44 - 1.00 mg/dL 0.70 0.59 0.58  Sodium 135 - 145  mmol/L 140 142 140  Potassium 3.5 - 5.1 mmol/L 3.5 3.1(L) 3.5  Chloride 98 - 111 mmol/L 103 110 104  CO2 22 - 32 mmol/L '27 22 27  '$ Calcium 8.9 - 10.3 mg/dL 9.0 7.2(L) 9.0  Total Protein 6.5 - 8.1 g/dL 6.8 4.8(L) 6.6  Total Bilirubin 0.3 - 1.2 mg/dL 0.3 0.3 0.9  Alkaline Phos 38 - 126 U/L 59 30(L) 42  AST 15 - 41 U/L 34 9(L) 17  ALT 0 - 44 U/L 87(H) 10 16    Lab Results  Component Value Date   WBC 11.7 (H) 01/18/2019   HGB 11.9 (L) 01/18/2019   HCT 35.7 (L) 01/18/2019   MCV 90.4 01/18/2019   PLT 548 (H) 01/18/2019   NEUTROABS 7.8 (H) 01/18/2019     ASSESSMENT & PLAN:  Malignant neoplasm of upper-outer quadrant of left breast in female, estrogen receptor positive ([place]) 10/22/18:Routine screening mammogram showed a 1.4cm mass in the left breast at the 2:00 position with no left axillary adenopathy. Biopsy showed IDC, grade 2, HER-2 equivocal (2+), ER+ 80%, PR+ 10%, Ki6715%.  11/10/2018:Left lumpectomy [name]): IDC, grade 2, 1.4cm, clear margins, and micrometastasis in 1 left axillary lymph node.ER 80%, PR 10%, Ki-67 15%, HER-2 equivocal 2+ by IHC but FISH negative  Oncotype DX score: 22 distant recurrence at 9 years with hormone therapy alone 18%  Recommendation:  1. Based upon tailor Rx data, I recommended systemic chemotherapy with Taxotere and Cytoxan every 3 weeks x4 cycles 2. Adj XRT 3. Adj Anti-estrogen therapy ----------------------------------------------------------------------------------------------------------------------------------------------------------- Current Treatment: Cycle 2 Taxotere Cytoxan Chemo toxicities: 1.    Migraines: I gave her a prescription for Maxalt 2.  Joint discomfort especially in the hips probably due to growth factor injection: Instructed to take Advil  Renewed her Xanax today. Labs are reviewed RTC in 3 weeks for cycle 3   No orders of the defined types were placed in this encounter.  The patient has a good understanding of the overall plan. she agrees with it. she will call with any problems that may develop before the next visit here.  Nicholas Lose, MD 01/18/2019  Julious Oka Dorshimer, am acting as scribe for Dr. Nicholas Lose.  I have reviewed the above document for accuracy and completeness, and I agree with the above.

## 2019-01-18 ENCOUNTER — Inpatient Hospital Stay: Payer: 59

## 2019-01-18 ENCOUNTER — Other Ambulatory Visit: Payer: Self-pay

## 2019-01-18 ENCOUNTER — Inpatient Hospital Stay (HOSPITAL_BASED_OUTPATIENT_CLINIC_OR_DEPARTMENT_OTHER): Payer: 59 | Admitting: Hematology and Oncology

## 2019-01-18 DIAGNOSIS — Z170 Estrogen receptor positive status [ER+]: Secondary | ICD-10-CM | POA: Diagnosis not present

## 2019-01-18 DIAGNOSIS — Z95828 Presence of other vascular implants and grafts: Secondary | ICD-10-CM

## 2019-01-18 DIAGNOSIS — C50412 Malignant neoplasm of upper-outer quadrant of left female breast: Secondary | ICD-10-CM

## 2019-01-18 DIAGNOSIS — Z5111 Encounter for antineoplastic chemotherapy: Secondary | ICD-10-CM | POA: Diagnosis not present

## 2019-01-18 LAB — CBC WITH DIFFERENTIAL (CANCER CENTER ONLY)
Abs Immature Granulocytes: 0.03 10*3/uL (ref 0.00–0.07)
Basophils Absolute: 0.1 10*3/uL (ref 0.0–0.1)
Basophils Relative: 1 %
Eosinophils Absolute: 0 10*3/uL (ref 0.0–0.5)
Eosinophils Relative: 0 %
HCT: 35.7 % — ABNORMAL LOW (ref 36.0–46.0)
Hemoglobin: 11.9 g/dL — ABNORMAL LOW (ref 12.0–15.0)
Immature Granulocytes: 0 %
Lymphocytes Relative: 22 %
Lymphs Abs: 2.6 10*3/uL (ref 0.7–4.0)
MCH: 30.1 pg (ref 26.0–34.0)
MCHC: 33.3 g/dL (ref 30.0–36.0)
MCV: 90.4 fL (ref 80.0–100.0)
Monocytes Absolute: 1.2 10*3/uL — ABNORMAL HIGH (ref 0.1–1.0)
Monocytes Relative: 10 %
Neutro Abs: 7.8 10*3/uL — ABNORMAL HIGH (ref 1.7–7.7)
Neutrophils Relative %: 67 %
Platelet Count: 548 10*3/uL — ABNORMAL HIGH (ref 150–400)
RBC: 3.95 MIL/uL (ref 3.87–5.11)
RDW: 13.6 % (ref 11.5–15.5)
WBC Count: 11.7 10*3/uL — ABNORMAL HIGH (ref 4.0–10.5)
nRBC: 0 % (ref 0.0–0.2)

## 2019-01-18 LAB — CMP (CANCER CENTER ONLY)
ALT: 11 U/L (ref 0–44)
AST: 7 U/L — ABNORMAL LOW (ref 15–41)
Albumin: 3.9 g/dL (ref 3.5–5.0)
Alkaline Phosphatase: 46 U/L (ref 38–126)
Anion gap: 13 (ref 5–15)
BUN: 12 mg/dL (ref 6–20)
CO2: 22 mmol/L (ref 22–32)
Calcium: 8.7 mg/dL — ABNORMAL LOW (ref 8.9–10.3)
Chloride: 107 mmol/L (ref 98–111)
Creatinine: 0.69 mg/dL (ref 0.44–1.00)
GFR, Est AFR Am: >60 mL/min (ref >60)
GFR, Estimated: >60 mL/min (ref >60)
Glucose, Bld: 90 mg/dL (ref 70–99)
Potassium: 3.7 mmol/L (ref 3.5–5.1)
Sodium: 142 mmol/L (ref 135–145)
Total Bilirubin: 0.4 mg/dL (ref 0.3–1.2)
Total Protein: 6.6 g/dL (ref 6.5–8.1)

## 2019-01-18 MED ORDER — SODIUM CHLORIDE 0.9 % IV SOLN
600.0000 mg/m2 | Freq: Once | INTRAVENOUS | Status: AC
  Administered 2019-01-18: 1100 mg via INTRAVENOUS
  Filled 2019-01-18: qty 55

## 2019-01-18 MED ORDER — SODIUM CHLORIDE 0.9% FLUSH
10.0000 mL | INTRAVENOUS | Status: DC | PRN
  Administered 2019-01-18: 12:00:00 10 mL
  Filled 2019-01-18: qty 10

## 2019-01-18 MED ORDER — PEGFILGRASTIM 6 MG/0.6ML ~~LOC~~ PSKT
6.0000 mg | PREFILLED_SYRINGE | Freq: Once | SUBCUTANEOUS | Status: AC
  Administered 2019-01-18: 6 mg via SUBCUTANEOUS

## 2019-01-18 MED ORDER — SODIUM CHLORIDE 0.9 % IV SOLN
Freq: Once | INTRAVENOUS | Status: AC
  Filled 2019-01-18: qty 250

## 2019-01-18 MED ORDER — PALONOSETRON HCL INJECTION 0.25 MG/5ML
INTRAVENOUS | Status: AC
  Filled 2019-01-18: qty 5

## 2019-01-18 MED ORDER — RIZATRIPTAN BENZOATE 10 MG PO TABS: 10.0000 mg | ORAL_TABLET | ORAL | 0 refills | Status: DC | PRN

## 2019-01-18 MED ORDER — SODIUM CHLORIDE 0.9% FLUSH
10.0000 mL | INTRAVENOUS | Status: DC | PRN
  Administered 2019-01-18: 10 mL via INTRAVENOUS
  Filled 2019-01-18: qty 10

## 2019-01-18 MED ORDER — DEXAMETHASONE SODIUM PHOSPHATE 10 MG/ML IJ SOLN
10.0000 mg | Freq: Once | INTRAMUSCULAR | Status: AC
  Administered 2019-01-18: 10 mg via INTRAVENOUS

## 2019-01-18 MED ORDER — ALPRAZOLAM 0.25 MG PO TABS: 0.2500 mg | ORAL_TABLET | Freq: Two times a day (BID) | ORAL | 1 refills | Status: DC | PRN

## 2019-01-18 MED ORDER — HEPARIN SOD (PORK) LOCK FLUSH 100 UNIT/ML IV SOLN
500.0000 [IU] | Freq: Once | INTRAVENOUS | Status: AC | PRN
  Administered 2019-01-18: 500 [IU]
  Filled 2019-01-18: qty 5

## 2019-01-18 MED ORDER — PEGFILGRASTIM 6 MG/0.6ML ~~LOC~~ PSKT
PREFILLED_SYRINGE | SUBCUTANEOUS | Status: AC
  Filled 2019-01-18: qty 0.6

## 2019-01-18 MED ORDER — DEXAMETHASONE SODIUM PHOSPHATE 10 MG/ML IJ SOLN
INTRAMUSCULAR | Status: AC
  Filled 2019-01-18: qty 1

## 2019-01-18 MED ORDER — PALONOSETRON HCL INJECTION 0.25 MG/5ML
0.2500 mg | Freq: Once | INTRAVENOUS | Status: AC
  Administered 2019-01-18: 0.25 mg via INTRAVENOUS

## 2019-01-18 MED ORDER — SODIUM CHLORIDE 0.9 % IV SOLN
75.0000 mg/m2 | Freq: Once | INTRAVENOUS | Status: AC
  Administered 2019-01-18: 10:00:00 140 mg via INTRAVENOUS
  Filled 2019-01-18: qty 14

## 2019-01-18 NOTE — Assessment & Plan Note (Signed)
10/22/18:Routine screening mammogram showed a 1.4cm mass in the left breast at the 2:00 position with no left axillary adenopathy. Biopsy showed IDC, grade 2, HER-2 equivocal (2+), ER+ 80%, PR+ 10%, Ki6715%.  11/10/2018:Left lumpectomy Courtney Williams): IDC, grade 2, 1.4cm, clear margins, and micrometastasis in 1 left axillary lymph node.ER 80%, PR 10%, Ki-67 15%, HER-2 equivocal 2+ by IHC but FISH negative  Oncotype DX score: 22 distant recurrence at 9 years with hormone therapy alone 18%  Recommendation:  1. Based upon tailor Rx data, I recommended systemic chemotherapy with Taxotere and Cytoxan every 3 weeks x4 cycles 2. Adj XRT 3. Adj Anti-estrogen therapy ----------------------------------------------------------------------------------------------------------------------------------------------------------- Current Treatment: Cycle 2 Taxotere Cytoxan Chemo toxicities: 1.  Intermittent headaches 2.  Joint discomfort especially in the hips probably due to growth factor injection  Labs are reviewed RTC in 3 weeks for cycle 3

## 2019-01-18 NOTE — Patient Instructions (Signed)
[facility] Discharge Instructions for Patients Receiving Chemotherapy  Today you received the following chemotherapy agents: Docetaxel (Taxotere) and Cyclophosphamide (Cytoxan)  To help prevent nausea and vomiting after your treatment, we encourage you to take your nausea medication as directed.    If you develop nausea and vomiting that is not controlled by your nausea medication, call the clinic.   BELOW ARE SYMPTOMS THAT SHOULD BE REPORTED IMMEDIATELY:  *FEVER GREATER THAN 100.5 F  *CHILLS WITH OR WITHOUT FEVER  NAUSEA AND VOMITING THAT IS NOT CONTROLLED WITH YOUR NAUSEA MEDICATION  *UNUSUAL SHORTNESS OF BREATH  *UNUSUAL BRUISING OR BLEEDING  TENDERNESS IN MOUTH AND THROAT WITH OR WITHOUT PRESENCE OF ULCERS  *URINARY PROBLEMS  *BOWEL PROBLEMS  UNUSUAL RASH Items with * indicate a potential emergency and should be followed up as soon as possible.  Feel free to call the clinic should you have any questions or concerns. The clinic phone number is [phone].  Please show the [place] at check-in to the Emergency Department and triage nurse.

## 2019-01-20 ENCOUNTER — Ambulatory Visit: Payer: 59

## 2019-02-07 NOTE — Progress Notes (Signed)
Patient Care Team: Patient, [name] as PCP - General (General Practice) Rockwell Germany, RN as Oncology Nurse Navigator Mauro Kaufmann, RN as Oncology Nurse Navigator Rolm Bookbinder, MD as Consulting Physician (General Surgery) Nicholas Lose, MD as Consulting Physician (Hematology and Oncology)  DIAGNOSIS:    ICD-10-CM   1. Malignant neoplasm of upper-outer quadrant of left breast in female, estrogen receptor positive ([place])  C50.412    Z17.0     SUMMARY OF ONCOLOGIC HISTORY: Oncology History  Malignant neoplasm of upper-outer quadrant of left breast in female, estrogen receptor positive ([place])  10/22/2018 Initial Diagnosis   Routine screening mammogram showed a 1.4cm mass in the left breast at the 2:00 position with no left axillary adenopathy. Biopsy showed IDC, grade 2, HER-2 equivocal (2+), ER+ 80%, PR+ 10%, Ki67 15%.   10/27/2018 Cancer Staging   Staging form: Breast, AJCC 8th Edition - Clinical stage from 10/27/2018: Stage IA (cT1c, cN0, cM0, G2, ER+, PR+, HER2-) - Signed by Nicholas Lose, MD on 10/27/2018   11/05/2018 Genetic Testing   Negative genetic testing: no pathogenic variants detected on the [facility] Breast Cancer STAT panel and Common Hereditary Cancers Panel. The report date is 11/05/2018.  The Breast Cancer STAT panel offered by [facility] includes sequencing and rearrangement analysis for the following 9 genes:  ATM, BRCA1, BRCA2, CDH1, CHEK2, PALB2, PTEN, STK11 and TP53.  The Common Hereditary Gene Panel offered by [facility] includes sequencing and/or deletion duplication testing of the following 48 genes: APC, ATM, AXIN2, BARD1, BMPR1A, BRCA1, BRCA2, BRIP1, CDH1, CDK4, CDKN2A (p14ARF), CDKN2A (p16INK4a), CHEK2, CTNNA1, DICER1, EPCAM (Deletion/duplication testing only), GREM1 (promoter region deletion/duplication testing only), KIT, MEN1, MLH1, MSH2, MSH3, MSH6, MUTYH, NBN, NF1, NHTL1, PALB2, PDGFRA, PMS2, POLD1, POLE, PTEN, RAD50, RAD51C, RAD51D, RNF43, SDHB, SDHC,  SDHD, SMAD4, SMARCA4. STK11, TP53, TSC1, TSC2, and VHL.  The following genes were evaluated for sequence changes only: SDHA and HOXB13 c.251G>A variant only.   11/10/2018 Surgery   Left lumpectomy [name]): IDC, grade 2, 1.4cm, clear margins, and micrometastasis in 1 left axillary lymph node.   11/30/2018 Oncotype testing   Score of 22, 18% chance of distant recurrence in 9 years without systemic treatment.   12/28/2018 -  Chemotherapy   The patient had palonosetron (ALOXI) injection 0.25 mg, 0.25 mg, Intravenous,  Once, 2 of 4 cycles Administration: 0.25 mg (12/28/2018), 0.25 mg (01/18/2019) pegfilgrastim (NEULASTA ONPRO KIT) injection 6 mg, 6 mg, Subcutaneous, Once, 2 of 4 cycles Administration: 6 mg (12/28/2018), 6 mg (01/18/2019) cyclophosphamide (CYTOXAN) 1,100 mg in sodium chloride 0.9 % 250 mL chemo infusion, 600 mg/m2 = 1,100 mg, Intravenous,  Once, 2 of 4 cycles Administration: 1,100 mg (12/28/2018), 1,100 mg (01/18/2019) DOCEtaxel (TAXOTERE) 140 mg in sodium chloride 0.9 % 250 mL chemo infusion, 75 mg/m2 = 140 mg, Intravenous,  Once, 2 of 4 cycles Administration: 140 mg (12/28/2018), 140 mg (01/18/2019)  for chemotherapy treatment.      CHIEF COMPLIANT: Cycle 3 Taxotere and Cytoxan  INTERVAL HISTORY: Courtney Williams is a 49 y.o. with above-mentioned history of left breast cancer who underwent a lumpectomy. She is currently on adjuvant chemotherapy with Taxotere and Cytoxan. She presents to the clinic todayfor a toxicity check and cycle 3.  She has done extremely well from the bone pain standpoint.  She did not have any headaches because she did not take as much nausea medication.  She did not have nausea either.  Some fatigue was noted.   ALLERGIES:  has No Known Allergies.  MEDICATIONS:  Current Outpatient Medications  Medication Sig Dispense Refill  . ALPRAZolam (XANAX) 0.25 MG tablet Take 1 tablet (0.25 mg total) by mouth 2 (two) times daily as needed for anxiety. 30 tablet 1    . dexamethasone (DECADRON) 4 MG tablet Take 1 tablet (4 mg total) by mouth daily. Take 1 tablet day before chemo and 1 tablet day after chemo with food 8 tablet 1  . fexofenadine (ALLEGRA) 180 MG tablet Take 180 mg by mouth daily as needed for allergies.     Marland Kitchen levothyroxine (SYNTHROID) 125 MCG tablet TAKE 1 TABLET (125 MCG TOTAL) BY MOUTH DAILY BEFORE BREAKFAST. 30 tablet 12  . lidocaine-prilocaine (EMLA) cream Apply to affected area once 30 g 3  . ondansetron (ZOFRAN) 8 MG tablet Take 1 tablet (8 mg total) by mouth 2 (two) times daily as needed for refractory nausea / vomiting. Start on day 3 after chemo. 30 tablet 1  . oxyCODONE (OXY IR/ROXICODONE) 5 MG immediate release tablet Take 1 tablet (5 mg total) by mouth every 6 (six) hours as needed for moderate pain, severe pain or breakthrough pain. 6 tablet 0  . prochlorperazine (COMPAZINE) 10 MG tablet Take 1 tablet (10 mg total) by mouth every 6 (six) hours as needed (Nausea or vomiting). 30 tablet 1  . rizatriptan (MAXALT) 10 MG tablet Take 1 tablet (10 mg total) by mouth as needed for migraine. May repeat in 2 hours if needed 10 tablet 0   No current facility-administered medications for this visit.    PHYSICAL EXAMINATION: ECOG PERFORMANCE STATUS: 1 - Symptomatic but completely ambulatory  Vitals:   02/08/19 0922  BP: 104/70  Pulse: 84  Resp: 17  Temp: 98.9 F (37.2 C)  SpO2: 98%   Filed Weights   02/08/19 0922  Weight: 172 lb 14.4 oz (78.4 kg)    LABORATORY DATA:  I have reviewed the data as listed CMP Latest Ref Rng & Units 01/18/2019 01/04/2019 12/27/2018  Glucose 70 - 99 mg/dL 90 117(H) 97  BUN 6 - 20 mg/dL '12 6 8  '$ Creatinine 0.44 - 1.00 mg/dL 0.69 0.70 0.59  Sodium 135 - 145 mmol/L 142 140 142  Potassium 3.5 - 5.1 mmol/L 3.7 3.5 3.1(L)  Chloride 98 - 111 mmol/L 107 103 110  CO2 22 - 32 mmol/L '22 27 22  '$ Calcium 8.9 - 10.3 mg/dL 8.7(L) 9.0 7.2(L)  Total Protein 6.5 - 8.1 g/dL 6.6 6.8 4.8(L)  Total Bilirubin 0.3 - 1.2  mg/dL 0.4 0.3 0.3  Alkaline Phos 38 - 126 U/L 46 59 30(L)  AST 15 - 41 U/L 7(L) 34 9(L)  ALT 0 - 44 U/L 11 87(H) 10    Lab Results  Component Value Date   WBC 11.7 (H) 01/18/2019   HGB 11.9 (L) 01/18/2019   HCT 35.7 (L) 01/18/2019   MCV 90.4 01/18/2019   PLT 548 (H) 01/18/2019   NEUTROABS 7.8 (H) 01/18/2019    ASSESSMENT & PLAN:  Malignant neoplasm of upper-outer quadrant of left breast in female, estrogen receptor positive ([place]) 10/22/18:Routine screening mammogram showed a 1.4cm mass in the left breast at the 2:00 position with no left axillary adenopathy. Biopsy showed IDC, grade 2, HER-2 equivocal (2+), ER+ 80%, PR+ 10%, Ki6715%.  11/10/2018:Left lumpectomy [name]): IDC, grade 2, 1.4cm, clear margins, and micrometastasis in 1 left axillary lymph node.ER 80%, PR 10%, Ki-67 15%, HER-2 equivocal 2+ by IHC but FISH negative  Oncotype DX score: 22 distant recurrence at 9 years with hormone therapy alone  18%  Recommendation: 1.Based upon tailor Rx data, I recommended systemic chemotherapy with Taxotere and Cytoxan every 3 weeks x4 cycles 2. Adj XRT 3. Adj Anti-estrogen therapy ----------------------------------------------------------------------------------------------------------------------------------------------------------- Current Treatment: Cycle 3 Taxotere Cytoxan Chemo toxicities: 1.Migraines: On Maxalt as needed 2. Musculoskeletal pains especially in the hips probably due to growth factor injection: Doing better with Advil  Labs are reviewed RTC in3 weeks for cycle 4  No orders of the defined types were placed in this encounter.  The patient has a good understanding of the overall plan. she agrees with it. she will call with any problems that may develop before the next visit here.  Total time spent: 30 mins including face to face time and time spent for planning, charting and coordination of care  Nicholas Lose, MD 02/08/2019  I, Cloyde Reams Dorshimer,  am acting as scribe for Dr. Nicholas Lose.  I have reviewed the above documentation for accuracy and completeness, and I agree with the above.

## 2019-02-08 ENCOUNTER — Inpatient Hospital Stay: Payer: 59

## 2019-02-08 ENCOUNTER — Other Ambulatory Visit: Payer: Self-pay

## 2019-02-08 ENCOUNTER — Other Ambulatory Visit: Payer: Self-pay | Admitting: *Deleted

## 2019-02-08 ENCOUNTER — Inpatient Hospital Stay (HOSPITAL_BASED_OUTPATIENT_CLINIC_OR_DEPARTMENT_OTHER): Payer: 59 | Admitting: Hematology and Oncology

## 2019-02-08 ENCOUNTER — Inpatient Hospital Stay: Payer: 59 | Attending: Hematology and Oncology

## 2019-02-08 DIAGNOSIS — Z79899 Other long term (current) drug therapy: Secondary | ICD-10-CM | POA: Insufficient documentation

## 2019-02-08 DIAGNOSIS — Z170 Estrogen receptor positive status [ER+]: Secondary | ICD-10-CM

## 2019-02-08 DIAGNOSIS — C50412 Malignant neoplasm of upper-outer quadrant of left female breast: Secondary | ICD-10-CM

## 2019-02-08 DIAGNOSIS — Z95828 Presence of other vascular implants and grafts: Secondary | ICD-10-CM | POA: Insufficient documentation

## 2019-02-08 DIAGNOSIS — R5383 Other fatigue: Secondary | ICD-10-CM | POA: Insufficient documentation

## 2019-02-08 DIAGNOSIS — M7918 Myalgia, other site: Secondary | ICD-10-CM | POA: Insufficient documentation

## 2019-02-08 DIAGNOSIS — T451X5A Adverse effect of antineoplastic and immunosuppressive drugs, initial encounter: Secondary | ICD-10-CM | POA: Insufficient documentation

## 2019-02-08 DIAGNOSIS — G43809 Other migraine, not intractable, without status migrainosus: Secondary | ICD-10-CM | POA: Diagnosis not present

## 2019-02-08 DIAGNOSIS — Z5111 Encounter for antineoplastic chemotherapy: Secondary | ICD-10-CM | POA: Diagnosis not present

## 2019-02-08 DIAGNOSIS — Z5189 Encounter for other specified aftercare: Secondary | ICD-10-CM | POA: Diagnosis not present

## 2019-02-08 LAB — CBC WITH DIFFERENTIAL (CANCER CENTER ONLY)
Abs Immature Granulocytes: 0.02 10*3/uL (ref 0.00–0.07)
Basophils Absolute: 0.1 10*3/uL (ref 0.0–0.1)
Basophils Relative: 1 %
Eosinophils Absolute: 0.1 10*3/uL (ref 0.0–0.5)
Eosinophils Relative: 1 %
HCT: 36 % (ref 36.0–46.0)
Hemoglobin: 12 g/dL (ref 12.0–15.0)
Immature Granulocytes: 0 %
Lymphocytes Relative: 11 %
Lymphs Abs: 1 10*3/uL (ref 0.7–4.0)
MCH: 30.2 pg (ref 26.0–34.0)
MCHC: 33.3 g/dL (ref 30.0–36.0)
MCV: 90.5 fL (ref 80.0–100.0)
Monocytes Absolute: 0.5 10*3/uL (ref 0.1–1.0)
Monocytes Relative: 5 %
Neutro Abs: 7.2 10*3/uL (ref 1.7–7.7)
Neutrophils Relative %: 82 %
Platelet Count: 360 10*3/uL (ref 150–400)
RBC: 3.98 MIL/uL (ref 3.87–5.11)
RDW: 14.6 % (ref 11.5–15.5)
WBC Count: 8.8 10*3/uL (ref 4.0–10.5)
nRBC: 0 % (ref 0.0–0.2)

## 2019-02-08 LAB — CMP (CANCER CENTER ONLY)
ALT: 12 U/L (ref 0–44)
AST: 9 U/L — ABNORMAL LOW (ref 15–41)
Albumin: 3.9 g/dL (ref 3.5–5.0)
Alkaline Phosphatase: 50 U/L (ref 38–126)
Anion gap: 8 (ref 5–15)
BUN: 13 mg/dL (ref 6–20)
CO2: 26 mmol/L (ref 22–32)
Calcium: 8.5 mg/dL — ABNORMAL LOW (ref 8.9–10.3)
Chloride: 106 mmol/L (ref 98–111)
Creatinine: 0.73 mg/dL (ref 0.44–1.00)
GFR, Est AFR Am: >60 mL/min (ref >60)
GFR, Estimated: >60 mL/min (ref >60)
Glucose, Bld: 104 mg/dL — ABNORMAL HIGH (ref 70–99)
Potassium: 4.1 mmol/L (ref 3.5–5.1)
Sodium: 140 mmol/L (ref 135–145)
Total Bilirubin: 0.4 mg/dL (ref 0.3–1.2)
Total Protein: 6.6 g/dL (ref 6.5–8.1)

## 2019-02-08 MED ORDER — HEPARIN SOD (PORK) LOCK FLUSH 100 UNIT/ML IV SOLN
500.0000 [IU] | Freq: Once | INTRAVENOUS | Status: AC | PRN
  Administered 2019-02-08: 500 [IU]
  Filled 2019-02-08: qty 5

## 2019-02-08 MED ORDER — PALONOSETRON HCL INJECTION 0.25 MG/5ML
INTRAVENOUS | Status: AC
  Filled 2019-02-08: qty 5

## 2019-02-08 MED ORDER — PALONOSETRON HCL INJECTION 0.25 MG/5ML
0.2500 mg | Freq: Once | INTRAVENOUS | Status: AC
  Administered 2019-02-08: 0.25 mg via INTRAVENOUS

## 2019-02-08 MED ORDER — SODIUM CHLORIDE 0.9 % IV SOLN
75.0000 mg/m2 | Freq: Once | INTRAVENOUS | Status: AC
  Administered 2019-02-08: 140 mg via INTRAVENOUS
  Filled 2019-02-08: qty 14

## 2019-02-08 MED ORDER — SODIUM CHLORIDE 0.9 % IV SOLN
Freq: Once | INTRAVENOUS | Status: AC
  Filled 2019-02-08: qty 250

## 2019-02-08 MED ORDER — PEGFILGRASTIM 6 MG/0.6ML ~~LOC~~ PSKT
6.0000 mg | PREFILLED_SYRINGE | Freq: Once | SUBCUTANEOUS | Status: AC
  Administered 2019-02-08: 13:00:00 6 mg via SUBCUTANEOUS

## 2019-02-08 MED ORDER — SODIUM CHLORIDE 0.9% FLUSH
10.0000 mL | Freq: Once | INTRAVENOUS | Status: AC
  Administered 2019-02-08: 10 mL
  Filled 2019-02-08: qty 10

## 2019-02-08 MED ORDER — DEXAMETHASONE SODIUM PHOSPHATE 10 MG/ML IJ SOLN
INTRAMUSCULAR | Status: AC
  Filled 2019-02-08: qty 1

## 2019-02-08 MED ORDER — DEXAMETHASONE SODIUM PHOSPHATE 10 MG/ML IJ SOLN
10.0000 mg | Freq: Once | INTRAMUSCULAR | Status: AC
  Administered 2019-02-08: 10:00:00 10 mg via INTRAVENOUS

## 2019-02-08 MED ORDER — SODIUM CHLORIDE 0.9% FLUSH
10.0000 mL | INTRAVENOUS | Status: DC | PRN
  Administered 2019-02-08: 10 mL
  Filled 2019-02-08: qty 10

## 2019-02-08 MED ORDER — PEGFILGRASTIM 6 MG/0.6ML ~~LOC~~ PSKT
PREFILLED_SYRINGE | SUBCUTANEOUS | Status: AC
  Filled 2019-02-08: qty 0.6

## 2019-02-08 MED ORDER — SODIUM CHLORIDE 0.9 % IV SOLN
600.0000 mg/m2 | Freq: Once | INTRAVENOUS | Status: AC
  Administered 2019-02-08: 1100 mg via INTRAVENOUS
  Filled 2019-02-08: qty 55

## 2019-02-08 NOTE — Assessment & Plan Note (Signed)
10/22/18:Routine screening mammogram showed a 1.4cm mass in the left breast at the 2:00 position with no left axillary adenopathy. Biopsy showed IDC, grade 2, HER-2 equivocal (2+), ER+ 80%, PR+ 10%, Ki6715%.  11/10/2018:Left lumpectomy Courtney Williams): IDC, grade 2, 1.4cm, clear margins, and micrometastasis in 1 left axillary lymph node.ER 80%, PR 10%, Ki-67 15%, HER-2 equivocal 2+ by IHC but FISH negative  Oncotype DX score: 22 distant recurrence at 9 years with hormone therapy alone 18%  Recommendation: 1.Based upon tailor Rx data, I recommended systemic chemotherapy with Taxotere and Cytoxan every 3 weeks x4 cycles 2. Adj XRT 3. Adj Anti-estrogen therapy ----------------------------------------------------------------------------------------------------------------------------------------------------------- Current Treatment: Cycle 3 Taxotere Cytoxan Chemo toxicities: 1.Migraines: On Maxalt as needed 2. Musculoskeletal pains especially in the hips probably due to growth factor injection: Doing better with Advil  Labs are reviewed RTC in3 weeks for cycle 4

## 2019-02-08 NOTE — Patient Instructions (Signed)
[facility] Discharge Instructions for Patients Receiving Chemotherapy  Today you received the following chemotherapy agents Taxotere and Cytoxan.  To help prevent nausea and vomiting after your treatment, we encourage you to take your nausea medication as directed BUT NO ZOFRAN FOR 3 DAYS AFTER CHEMO.   If you develop nausea and vomiting that is not controlled by your nausea medication, call the clinic.   BELOW ARE SYMPTOMS THAT SHOULD BE REPORTED IMMEDIATELY:  *FEVER GREATER THAN 100.5 F  *CHILLS WITH OR WITHOUT FEVER  NAUSEA AND VOMITING THAT IS NOT CONTROLLED WITH YOUR NAUSEA MEDICATION  *UNUSUAL SHORTNESS OF BREATH  *UNUSUAL BRUISING OR BLEEDING  TENDERNESS IN MOUTH AND THROAT WITH OR WITHOUT PRESENCE OF ULCERS  *URINARY PROBLEMS  *BOWEL PROBLEMS  UNUSUAL RASH Items with * indicate a potential emergency and should be followed up as soon as possible.  Feel free to call the clinic you have any questions or concerns. The clinic phone number is [phone].  Please show the [place] at check-in to the Emergency Department and triage nurse.

## 2019-02-09 ENCOUNTER — Telehealth: Payer: Self-pay | Admitting: Radiation Oncology

## 2019-02-09 NOTE — Telephone Encounter (Signed)
New message:  Cld patient to try and set up appt from referral received. Unable to leave a message due to voicemail box being full.

## 2019-02-10 ENCOUNTER — Ambulatory Visit: Payer: 59

## 2019-02-28 NOTE — Progress Notes (Signed)
Patient Care Team: Patient, [name] as PCP - General (General Practice) Rockwell Germany, RN as Oncology Nurse Navigator Mauro Kaufmann, RN as Oncology Nurse Navigator Rolm Bookbinder, MD as Consulting Physician (General Surgery) Nicholas Lose, MD as Consulting Physician (Hematology and Oncology)  DIAGNOSIS:    ICD-10-CM   1. Malignant neoplasm of upper-outer quadrant of left breast in female, estrogen receptor positive ([place])  C50.412    Z17.0     SUMMARY OF ONCOLOGIC HISTORY: Oncology History  Malignant neoplasm of upper-outer quadrant of left breast in female, estrogen receptor positive ([place])  10/22/2018 Initial Diagnosis   Routine screening mammogram showed a 1.4cm mass in the left breast at the 2:00 position with no left axillary adenopathy. Biopsy showed IDC, grade 2, HER-2 equivocal (2+), ER+ 80%, PR+ 10%, Ki67 15%.   10/27/2018 Cancer Staging   Staging form: Breast, AJCC 8th Edition - Clinical stage from 10/27/2018: Stage IA (cT1c, cN0, cM0, G2, ER+, PR+, HER2-) - Signed by Nicholas Lose, MD on 10/27/2018   11/05/2018 Genetic Testing   Negative genetic testing: no pathogenic variants detected on the [facility] Breast Cancer STAT panel and Common Hereditary Cancers Panel. The report date is 11/05/2018.  The Breast Cancer STAT panel offered by [facility] includes sequencing and rearrangement analysis for the following 9 genes:  ATM, BRCA1, BRCA2, CDH1, CHEK2, PALB2, PTEN, STK11 and TP53.  The Common Hereditary Gene Panel offered by [facility] includes sequencing and/or deletion duplication testing of the following 48 genes: APC, ATM, AXIN2, BARD1, BMPR1A, BRCA1, BRCA2, BRIP1, CDH1, CDK4, CDKN2A (p14ARF), CDKN2A (p16INK4a), CHEK2, CTNNA1, DICER1, EPCAM (Deletion/duplication testing only), GREM1 (promoter region deletion/duplication testing only), KIT, MEN1, MLH1, MSH2, MSH3, MSH6, MUTYH, NBN, NF1, NHTL1, PALB2, PDGFRA, PMS2, POLD1, POLE, PTEN, RAD50, RAD51C, RAD51D, RNF43, SDHB, SDHC,  SDHD, SMAD4, SMARCA4. STK11, TP53, TSC1, TSC2, and VHL.  The following genes were evaluated for sequence changes only: SDHA and HOXB13 c.251G>A variant only.   11/10/2018 Surgery   Left lumpectomy [name]): IDC, grade 2, 1.4cm, clear margins, and micrometastasis in 1 left axillary lymph node.   11/30/2018 Oncotype testing   Score of 22, 18% chance of distant recurrence in 9 years without systemic treatment.   12/28/2018 -  Chemotherapy   The patient had palonosetron (ALOXI) injection 0.25 mg, 0.25 mg, Intravenous,  Once, 3 of 4 cycles Administration: 0.25 mg (12/28/2018), 0.25 mg (01/18/2019), 0.25 mg (02/08/2019) pegfilgrastim (NEULASTA ONPRO KIT) injection 6 mg, 6 mg, Subcutaneous, Once, 3 of 4 cycles Administration: 6 mg (12/28/2018), 6 mg (01/18/2019), 6 mg (02/08/2019) cyclophosphamide (CYTOXAN) 1,100 mg in sodium chloride 0.9 % 250 mL chemo infusion, 600 mg/m2 = 1,100 mg, Intravenous,  Once, 3 of 4 cycles Administration: 1,100 mg (12/28/2018), 1,100 mg (01/18/2019), 1,100 mg (02/08/2019) DOCEtaxel (TAXOTERE) 140 mg in sodium chloride 0.9 % 250 mL chemo infusion, 75 mg/m2 = 140 mg, Intravenous,  Once, 3 of 4 cycles Administration: 140 mg (12/28/2018), 140 mg (01/18/2019), 140 mg (02/08/2019)  for chemotherapy treatment.      CHIEF COMPLIANT: Cycle4Taxotere and Cytoxan  INTERVAL HISTORY: Courtney Williams is a 49 y.o. with above-mentioned history of left breast cancer who underwent a lumpectomy. She is currently on adjuvant chemotherapy with Taxotere and Cytoxan. She presents to the clinic todayfor a toxicity checkandcycle4.  She is tolerating the last chemotherapy extremely well without any problems or concerns.  Denies any nausea vomiting denies any bowel disturbances.  Denies neuropathy.  ALLERGIES:  has No Known Allergies.  MEDICATIONS:  Current Outpatient Medications  Medication Sig Dispense Refill  . ALPRAZolam (XANAX) 0.25 MG tablet Take 1 tablet (0.25 mg total) by mouth 2 (two)  times daily as needed for anxiety. 30 tablet 1  . dexamethasone (DECADRON) 4 MG tablet Take 1 tablet (4 mg total) by mouth daily. Take 1 tablet day before chemo and 1 tablet day after chemo with food 8 tablet 1  . fexofenadine (ALLEGRA) 180 MG tablet Take 180 mg by mouth daily as needed for allergies.     Marland Kitchen levothyroxine (SYNTHROID) 125 MCG tablet TAKE 1 TABLET (125 MCG TOTAL) BY MOUTH DAILY BEFORE BREAKFAST. 30 tablet 12  . lidocaine-prilocaine (EMLA) cream Apply to affected area once 30 g 3  . ondansetron (ZOFRAN) 8 MG tablet Take 1 tablet (8 mg total) by mouth 2 (two) times daily as needed for refractory nausea / vomiting. Start on day 3 after chemo. 30 tablet 1  . oxyCODONE (OXY IR/ROXICODONE) 5 MG immediate release tablet Take 1 tablet (5 mg total) by mouth every 6 (six) hours as needed for moderate pain, severe pain or breakthrough pain. 6 tablet 0  . prochlorperazine (COMPAZINE) 10 MG tablet Take 1 tablet (10 mg total) by mouth every 6 (six) hours as needed (Nausea or vomiting). 30 tablet 1  . rizatriptan (MAXALT) 10 MG tablet Take 1 tablet (10 mg total) by mouth as needed for migraine. May repeat in 2 hours if needed 10 tablet 0   No current facility-administered medications for this visit.    PHYSICAL EXAMINATION: ECOG PERFORMANCE STATUS: 1 - Symptomatic but completely ambulatory  Vitals:   03/01/19 1002  BP: 121/86  Pulse: 83  Resp: 20  Temp: 98.3 F (36.8 C)  SpO2: 100%   Filed Weights   03/01/19 1002  Weight: 175 lb 11.2 oz (79.7 kg)    LABORATORY DATA:  I have reviewed the data as listed CMP Latest Ref Rng & Units 02/08/2019 01/18/2019 01/04/2019  Glucose 70 - 99 mg/dL 104(H) 90 117(H)  BUN 6 - 20 mg/dL '13 12 6  '$ Creatinine 0.44 - 1.00 mg/dL 0.73 0.69 0.70  Sodium 135 - 145 mmol/L 140 142 140  Potassium 3.5 - 5.1 mmol/L 4.1 3.7 3.5  Chloride 98 - 111 mmol/L 106 107 103  CO2 22 - 32 mmol/L '26 22 27  '$ Calcium 8.9 - 10.3 mg/dL 8.5(L) 8.7(L) 9.0  Total Protein 6.5 - 8.1  g/dL 6.6 6.6 6.8  Total Bilirubin 0.3 - 1.2 mg/dL 0.4 0.4 0.3  Alkaline Phos 38 - 126 U/L 50 46 59  AST 15 - 41 U/L 9(L) 7(L) 34  ALT 0 - 44 U/L 12 11 87(H)    Lab Results  Component Value Date   WBC 8.7 03/01/2019   HGB 11.4 (L) 03/01/2019   HCT 34.7 (L) 03/01/2019   MCV 92.5 03/01/2019   PLT 364 03/01/2019   NEUTROABS 6.0 03/01/2019    ASSESSMENT & PLAN:  Malignant neoplasm of upper-outer quadrant of left breast in female, estrogen receptor positive ([place]) 10/22/18:Routine screening mammogram showed a 1.4cm mass in the left breast at the 2:00 position with no left axillary adenopathy. Biopsy showed IDC, grade 2, HER-2 equivocal (2+), ER+ 80%, PR+ 10%, Ki6715%.  11/10/2018:Left lumpectomy [name]): IDC, grade 2, 1.4cm, clear margins, and micrometastasis in 1 left axillary lymph node.ER 80%, PR 10%, Ki-67 15%, HER-2 equivocal 2+ by IHC but FISH negative  Oncotype DX score: 22 distant recurrence at 9 years with hormone therapy alone 18%  Recommendation: 1.Based upon tailor Rx data, I recommended  systemic chemotherapy with Taxotere and Cytoxan every 3 weeks x4 cycles 2. Adj XRT 3. Adj Anti-estrogen therapy ----------------------------------------------------------------------------------------------------------------------------------------------------------- Current Treatment: Cycle4Taxotere Cytoxan Chemo toxicities: 1.Migraines: On Maxalt as needed 2. Musculoskeletal pains especially in the hips probably due to growth factor injection: Doing better with Advil I sent a message to Dr. [name] to get the port removed. Patient has an appointment with radiation oncology. Return to clinic at the end of radiation to start antiestrogen therapy.      No orders of the defined types were placed in this encounter.  The patient has a good understanding of the overall plan. she agrees with it. she will call with any problems that may develop before the next visit here.   Total time spent: 30 mins including face to face time and time spent for planning, charting and coordination of care  Nicholas Lose, MD 03/01/2019  I, Cloyde Reams Dorshimer, am acting as scribe for Dr. Nicholas Lose.  I have reviewed the above documentation for accuracy and completeness, and I agree with the above.

## 2019-03-01 ENCOUNTER — Inpatient Hospital Stay: Payer: 59

## 2019-03-01 ENCOUNTER — Inpatient Hospital Stay (HOSPITAL_BASED_OUTPATIENT_CLINIC_OR_DEPARTMENT_OTHER): Payer: 59 | Admitting: Hematology and Oncology

## 2019-03-01 ENCOUNTER — Encounter: Payer: Self-pay | Admitting: *Deleted

## 2019-03-01 ENCOUNTER — Other Ambulatory Visit: Payer: Self-pay

## 2019-03-01 ENCOUNTER — Inpatient Hospital Stay: Payer: 59 | Attending: Hematology and Oncology

## 2019-03-01 DIAGNOSIS — R21 Rash and other nonspecific skin eruption: Secondary | ICD-10-CM | POA: Diagnosis not present

## 2019-03-01 DIAGNOSIS — Z9221 Personal history of antineoplastic chemotherapy: Secondary | ICD-10-CM | POA: Insufficient documentation

## 2019-03-01 DIAGNOSIS — Z95828 Presence of other vascular implants and grafts: Secondary | ICD-10-CM

## 2019-03-01 DIAGNOSIS — Z806 Family history of leukemia: Secondary | ICD-10-CM | POA: Insufficient documentation

## 2019-03-01 DIAGNOSIS — C50412 Malignant neoplasm of upper-outer quadrant of left female breast: Secondary | ICD-10-CM | POA: Insufficient documentation

## 2019-03-01 DIAGNOSIS — Z170 Estrogen receptor positive status [ER+]: Secondary | ICD-10-CM

## 2019-03-01 DIAGNOSIS — G43909 Migraine, unspecified, not intractable, without status migrainosus: Secondary | ICD-10-CM | POA: Insufficient documentation

## 2019-03-01 DIAGNOSIS — Z5111 Encounter for antineoplastic chemotherapy: Secondary | ICD-10-CM | POA: Diagnosis present

## 2019-03-01 DIAGNOSIS — F419 Anxiety disorder, unspecified: Secondary | ICD-10-CM | POA: Insufficient documentation

## 2019-03-01 DIAGNOSIS — Z8249 Family history of ischemic heart disease and other diseases of the circulatory system: Secondary | ICD-10-CM | POA: Diagnosis not present

## 2019-03-01 DIAGNOSIS — L509 Urticaria, unspecified: Secondary | ICD-10-CM | POA: Insufficient documentation

## 2019-03-01 DIAGNOSIS — F17210 Nicotine dependence, cigarettes, uncomplicated: Secondary | ICD-10-CM | POA: Diagnosis not present

## 2019-03-01 DIAGNOSIS — M7918 Myalgia, other site: Secondary | ICD-10-CM | POA: Diagnosis not present

## 2019-03-01 DIAGNOSIS — Z923 Personal history of irradiation: Secondary | ICD-10-CM | POA: Insufficient documentation

## 2019-03-01 DIAGNOSIS — Z5189 Encounter for other specified aftercare: Secondary | ICD-10-CM | POA: Diagnosis not present

## 2019-03-01 DIAGNOSIS — Z8349 Family history of other endocrine, nutritional and metabolic diseases: Secondary | ICD-10-CM | POA: Diagnosis not present

## 2019-03-01 DIAGNOSIS — Z79899 Other long term (current) drug therapy: Secondary | ICD-10-CM | POA: Insufficient documentation

## 2019-03-01 HISTORY — DX: Personal history of antineoplastic chemotherapy: Z92.21

## 2019-03-01 LAB — CMP (CANCER CENTER ONLY)
ALT: 13 U/L (ref 0–44)
AST: 11 U/L — ABNORMAL LOW (ref 15–41)
Albumin: 3.8 g/dL (ref 3.5–5.0)
Alkaline Phosphatase: 44 U/L (ref 38–126)
Anion gap: 6 (ref 5–15)
BUN: 13 mg/dL (ref 6–20)
CO2: 26 mmol/L (ref 22–32)
Calcium: 8.5 mg/dL — ABNORMAL LOW (ref 8.9–10.3)
Chloride: 109 mmol/L (ref 98–111)
Creatinine: 0.69 mg/dL (ref 0.44–1.00)
GFR, Est AFR Am: >60 mL/min (ref >60)
GFR, Estimated: >60 mL/min (ref >60)
Glucose, Bld: 93 mg/dL (ref 70–99)
Potassium: 3.6 mmol/L (ref 3.5–5.1)
Sodium: 141 mmol/L (ref 135–145)
Total Bilirubin: 0.4 mg/dL (ref 0.3–1.2)
Total Protein: 6.4 g/dL — ABNORMAL LOW (ref 6.5–8.1)

## 2019-03-01 LAB — CBC WITH DIFFERENTIAL (CANCER CENTER ONLY)
Abs Immature Granulocytes: 0.01 10*3/uL (ref 0.00–0.07)
Basophils Absolute: 0 10*3/uL (ref 0.0–0.1)
Basophils Relative: 1 %
Eosinophils Absolute: 0 10*3/uL (ref 0.0–0.5)
Eosinophils Relative: 0 %
HCT: 34.7 % — ABNORMAL LOW (ref 36.0–46.0)
Hemoglobin: 11.4 g/dL — ABNORMAL LOW (ref 12.0–15.0)
Immature Granulocytes: 0 %
Lymphocytes Relative: 21 %
Lymphs Abs: 1.8 10*3/uL (ref 0.7–4.0)
MCH: 30.4 pg (ref 26.0–34.0)
MCHC: 32.9 g/dL (ref 30.0–36.0)
MCV: 92.5 fL (ref 80.0–100.0)
Monocytes Absolute: 0.8 10*3/uL (ref 0.1–1.0)
Monocytes Relative: 9 %
Neutro Abs: 6 10*3/uL (ref 1.7–7.7)
Neutrophils Relative %: 69 %
Platelet Count: 364 10*3/uL (ref 150–400)
RBC: 3.75 MIL/uL — ABNORMAL LOW (ref 3.87–5.11)
RDW: 15.7 % — ABNORMAL HIGH (ref 11.5–15.5)
WBC Count: 8.7 10*3/uL (ref 4.0–10.5)
nRBC: 0 % (ref 0.0–0.2)

## 2019-03-01 MED ORDER — SODIUM CHLORIDE 0.9 % IV SOLN
Freq: Once | INTRAVENOUS | Status: AC
  Filled 2019-03-01: qty 250

## 2019-03-01 MED ORDER — PALONOSETRON HCL INJECTION 0.25 MG/5ML
0.2500 mg | Freq: Once | INTRAVENOUS | Status: AC
  Administered 2019-03-01: 0.25 mg via INTRAVENOUS

## 2019-03-01 MED ORDER — SODIUM CHLORIDE 0.9% FLUSH
10.0000 mL | INTRAVENOUS | Status: DC | PRN
  Administered 2019-03-01: 14:00:00 10 mL
  Filled 2019-03-01: qty 10

## 2019-03-01 MED ORDER — PEGFILGRASTIM 6 MG/0.6ML ~~LOC~~ PSKT
6.0000 mg | PREFILLED_SYRINGE | Freq: Once | SUBCUTANEOUS | Status: AC
  Administered 2019-03-01: 6 mg via SUBCUTANEOUS

## 2019-03-01 MED ORDER — SODIUM CHLORIDE 0.9 % IV SOLN
600.0000 mg/m2 | Freq: Once | INTRAVENOUS | Status: AC
  Administered 2019-03-01: 14:00:00 1100 mg via INTRAVENOUS
  Filled 2019-03-01: qty 55

## 2019-03-01 MED ORDER — DEXAMETHASONE SODIUM PHOSPHATE 10 MG/ML IJ SOLN
10.0000 mg | Freq: Once | INTRAMUSCULAR | Status: AC
  Administered 2019-03-01: 12:00:00 10 mg via INTRAVENOUS

## 2019-03-01 MED ORDER — PEGFILGRASTIM 6 MG/0.6ML ~~LOC~~ PSKT
PREFILLED_SYRINGE | SUBCUTANEOUS | Status: AC
  Filled 2019-03-01: qty 0.6

## 2019-03-01 MED ORDER — SODIUM CHLORIDE 0.9% FLUSH
10.0000 mL | Freq: Once | INTRAVENOUS | Status: AC
  Administered 2019-03-01: 10:00:00 10 mL
  Filled 2019-03-01: qty 10

## 2019-03-01 MED ORDER — PALONOSETRON HCL INJECTION 0.25 MG/5ML
INTRAVENOUS | Status: AC
  Filled 2019-03-01: qty 5

## 2019-03-01 MED ORDER — SODIUM CHLORIDE 0.9 % IV SOLN
75.0000 mg/m2 | Freq: Once | INTRAVENOUS | Status: AC
  Administered 2019-03-01: 140 mg via INTRAVENOUS
  Filled 2019-03-01: qty 14

## 2019-03-01 MED ORDER — HEPARIN SOD (PORK) LOCK FLUSH 100 UNIT/ML IV SOLN
500.0000 [IU] | Freq: Once | INTRAVENOUS | Status: AC | PRN
  Administered 2019-03-01: 14:00:00 500 [IU]
  Filled 2019-03-01: qty 5

## 2019-03-01 MED ORDER — DEXAMETHASONE SODIUM PHOSPHATE 10 MG/ML IJ SOLN
INTRAMUSCULAR | Status: AC
  Filled 2019-03-01: qty 1

## 2019-03-01 NOTE — Patient Instructions (Signed)

## 2019-03-01 NOTE — Assessment & Plan Note (Signed)
10/22/18:Routine screening mammogram showed a 1.4cm mass in the left breast at the 2:00 position with no left axillary adenopathy. Biopsy showed IDC, grade 2, HER-2 equivocal (2+), ER+ 80%, PR+ 10%, Ki6715%.  11/10/2018:Left lumpectomy Courtney Williams): IDC, grade 2, 1.4cm, clear margins, and micrometastasis in 1 left axillary lymph node.ER 80%, PR 10%, Ki-67 15%, HER-2 equivocal 2+ by IHC but FISH negative  Oncotype DX score: 22 distant recurrence at 9 years with hormone therapy alone 18%  Recommendation: 1.Based upon tailor Rx data, I recommended systemic chemotherapy with Taxotere and Cytoxan every 3 weeks x4 cycles 2. Adj XRT 3. Adj Anti-estrogen therapy ----------------------------------------------------------------------------------------------------------------------------------------------------------- Current Treatment: Cycle4Taxotere Cytoxan Chemo toxicities: 1.Migraines: On Maxalt as needed 2. Musculoskeletal pains especially in the hips probably due to growth factor injection: Doing better with Advil  Patient will need radiation oncology consultation. Return to clinic at the end of radiation to start antiestrogen therapy.

## 2019-03-01 NOTE — Patient Instructions (Signed)
[facility] Discharge Instructions for Patients Receiving Chemotherapy  Today you received the following chemotherapy agents Taxotere and Cytoxan  To help prevent nausea and vomiting after your treatment, we encourage you to take your nausea medication as directed.    If you develop nausea and vomiting that is not controlled by your nausea medication, call the clinic.   BELOW ARE SYMPTOMS THAT SHOULD BE REPORTED IMMEDIATELY:  *FEVER GREATER THAN 100.5 F  *CHILLS WITH OR WITHOUT FEVER  NAUSEA AND VOMITING THAT IS NOT CONTROLLED WITH YOUR NAUSEA MEDICATION  *UNUSUAL SHORTNESS OF BREATH  *UNUSUAL BRUISING OR BLEEDING  TENDERNESS IN MOUTH AND THROAT WITH OR WITHOUT PRESENCE OF ULCERS  *URINARY PROBLEMS  *BOWEL PROBLEMS  UNUSUAL RASH Items with * indicate a potential emergency and should be followed up as soon as possible.  Feel free to call the clinic should you have any questions or concerns. The clinic phone number is [phone].  Please show the CHEMO ALERT CARD at check-in to the Emergency Department and triage nurse.   

## 2019-03-03 ENCOUNTER — Ambulatory Visit: Payer: 59

## 2019-03-09 ENCOUNTER — Other Ambulatory Visit: Payer: Self-pay | Admitting: *Deleted

## 2019-03-09 MED ORDER — METHYLPREDNISOLONE 4 MG PO TBPK: ORAL_TABLET | ORAL | 0 refills | Status: DC

## 2019-03-09 NOTE — Progress Notes (Signed)
Received call from pt stating she is experiencing a red blotchy rash that is causing her to itch on her bilateral legs and arms, neck, and bilateral eyelids.  Pt states symptoms developed yesterday and are relieved some by Allegra.  Per MD, pt experiencing delayed intolerance to the Taxotere she received on 2/9.  Per MD prescription for medrol dose pack will be sent to pharmacy on file and instructed pt to take as directed.  Also educated pt on OTC benadryl cream and hydrocortisone cream.  Pt verbalized understanding and will update the office if symptoms become worse.

## 2019-03-11 ENCOUNTER — Encounter: Payer: Self-pay | Admitting: Hematology and Oncology

## 2019-03-11 ENCOUNTER — Inpatient Hospital Stay (HOSPITAL_BASED_OUTPATIENT_CLINIC_OR_DEPARTMENT_OTHER): Payer: 59 | Admitting: Medical

## 2019-03-11 ENCOUNTER — Other Ambulatory Visit: Payer: Self-pay

## 2019-03-11 ENCOUNTER — Telehealth: Payer: Self-pay | Admitting: *Deleted

## 2019-03-11 VITALS — BP 122/86 | HR 89 | Temp 98.9°F | Resp 18 | Ht 61.75 in | Wt 176.1 lb

## 2019-03-11 DIAGNOSIS — C50412 Malignant neoplasm of upper-outer quadrant of left female breast: Secondary | ICD-10-CM

## 2019-03-11 DIAGNOSIS — Z170 Estrogen receptor positive status [ER+]: Secondary | ICD-10-CM

## 2019-03-11 DIAGNOSIS — Z5111 Encounter for antineoplastic chemotherapy: Secondary | ICD-10-CM | POA: Diagnosis not present

## 2019-03-11 DIAGNOSIS — L509 Urticaria, unspecified: Secondary | ICD-10-CM

## 2019-03-11 MED ORDER — PREDNISOLONE 5 MG PO TABS: ORAL_TABLET | ORAL | 0 refills | Status: DC

## 2019-03-11 MED ORDER — METHYLPREDNISOLONE 4 MG PO TBPK: ORAL_TABLET | ORAL | 0 refills | Status: DC

## 2019-03-11 MED ORDER — FEXOFENADINE HCL 60 MG PO TABS: 60.0000 mg | ORAL_TABLET | Freq: Two times a day (BID) | ORAL | 0 refills | Status: DC

## 2019-03-11 MED ORDER — TRIAMCINOLONE ACETONIDE 0.1 % EX LOTN: 1.0000 "application " | TOPICAL_LOTION | Freq: Three times a day (TID) | CUTANEOUS | 1 refills | Status: DC

## 2019-03-11 MED ORDER — ALPRAZOLAM 0.25 MG PO TABS: 0.2500 mg | ORAL_TABLET | Freq: Two times a day (BID) | ORAL | 1 refills | Status: DC | PRN

## 2019-03-11 MED ORDER — FEXOFENADINE HCL 60 MG PO TABS: 60.0000 mg | ORAL_TABLET | Freq: Two times a day (BID) | ORAL | 0 refills | Status: AC

## 2019-03-11 MED ORDER — LORATADINE 10 MG PO TABS: 10.0000 mg | ORAL_TABLET | Freq: Every day | ORAL | 0 refills | Status: DC
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
## 2019-03-11 NOTE — Patient Instructions (Signed)
Rash, Adult A rash is a change in the color of your skin. A rash can also change the way your skin feels. There are many different conditions and factors that can cause a rash. Some rashes may disappear after a few days, but some may last for a few weeks. Common causes of rashes include:  Viral infections, such as: ? Colds. ? Measles. ? Hand, foot, and mouth disease.  Bacterial infections, such as: ? Scarlet fever. ? Impetigo.  Fungal infections, such as Candida.  Allergic reactions to food, medicines, or skin care products. Follow these instructions at home: The goal of treatment is to stop the itching and keep the rash from spreading. Pay attention to any changes in your symptoms. Follow these instructions to help with your condition: Medicine Take or apply over-the-counter and prescription medicines only as told by your health care provider. These may include:  Corticosteroid creams to treat red or swollen skin.  Anti-itch lotions.  Oral allergy medicines (antihistamines).  Oral corticosteroids for severe symptoms.  Skin care  Apply cool compresses to the affected areas.  Do not scratch or rub your skin.  Avoid covering the rash. Make sure the rash is exposed to air as much as possible. Managing itching and discomfort  Avoid hot showers or baths, which can make itching worse. A cold shower may help.  Try taking a bath with: ? Epsom salts. Follow manufacturer instructions on the packaging. You can get these at your local pharmacy or grocery store. ? Baking soda. Pour a small amount into the bath as told by your health care provider. ? Colloidal oatmeal. Follow manufacturer instructions on the packaging. You can get this at your local pharmacy or grocery store.  Try applying baking soda paste to your skin. Stir water into baking soda until it reaches a paste-like consistency.  Try applying calamine lotion. This is an over-the-counter lotion that helps to relieve  itchiness.  Keep cool and out of the sun. Sweating and being hot can make itching worse. General instructions   Rest as needed.  Drink enough fluid to keep your urine pale yellow.  Wear loose-fitting clothing.  Avoid scented soaps, detergents, and perfumes. Use gentle soaps, detergents, perfumes, and other cosmetic products.  Avoid any substance that causes your rash. Keep a journal to help track what causes your rash. Write down: ? What you eat. ? What cosmetic products you use. ? What you drink. ? What you wear. This includes jewelry.  Keep all follow-up visits as told by your health care provider. This is important. Contact a health care provider if:  You sweat at night.  You lose weight.  You urinate more than normal.  You urinate less than normal, or you notice that your urine is a darker color than usual.  You feel weak.  You vomit.  Your skin or the whites of your eyes look yellow (jaundice).  Your skin: ? Tingles. ? Is numb.  Your rash: ? Does not go away after several days. ? Gets worse.  You are: ? Unusually thirsty. ? More tired than normal.  You have: ? New symptoms. ? Pain in your abdomen. ? A fever. ? Diarrhea. Get help right away if you:  Have a fever and your symptoms suddenly get worse.  Develop confusion.  Have a severe headache or a stiff neck.  Have severe joint pains or stiffness.  Have a seizure.  Develop a rash that covers all or most of your body. The rash may   or may not be painful.  Develop blisters that: ? Are on top of the rash. ? Grow larger or grow together. ? Are painful. ? Are inside your nose or mouth.  Develop a rash that: ? Looks like purple pinprick-sized spots all over your body. ? Has a "bull's eye" or looks like a target. ? Is not related to sun exposure, is red and painful, and causes your skin to peel. Summary  A rash is a change in the color of your skin. Some rashes disappear after a few days,  but some may last for a few weeks.  The goal of treatment is to stop the itching and keep the rash from spreading.  Take or apply over-the-counter and prescription medicines only as told by your health care provider.  Contact a health care provider if you have new or worsening symptoms.  Keep all follow-up visits as told by your health care provider. This is important. This information is not intended to replace advice given to you by your health care provider. Make sure you discuss any questions you have with your health care provider. Document Revised: 04/30/2018 Document Reviewed: 08/10/2017 Elsevier Patient Education  2020 [employer]  

## 2019-03-11 NOTE — Telephone Encounter (Signed)
Received mychart message from pt regarding worsening rash on bilateral legs, arms, chest, and neck.  Pt states medrol dose pack and benadryl cream have not relieved symptoms and the rash has become worse over the last 2 days.  Per MD pt to be seen in [facility] today.  Learta Codding, RN notified and high priority message sent to scheduling.

## 2019-03-12 NOTE — Progress Notes (Signed)
Symptoms Management Clinic Progress Note   KENNICE TRIVETTE [id number] 01-24-1970 49 y.o.  Courtney Williams is managed by Dr. Nicholas Lose  Actively treated with chemotherapy/immunotherapy/hormonal therapy: Courtney Williams is undergoing radiation therapy.  Next scheduled appointment with provider: No follow-up appointment has been scheduled.  Assessment: Plan:    Urticaria - Plan: ALPRAZolam (XANAX) 0.25 MG tablet, fexofenadine (ALLEGRA ALLERGY) 60 MG tablet, loratadine (CLARITIN) 10 MG tablet, methylPREDNISolone (MEDROL DOSEPAK) 4 MG TBPK tablet, triamcinolone lotion (KENALOG) 0.1 %, prednisoLONE 5 MG TABS tablet, DISCONTINUED: methylPREDNISolone (MEDROL DOSEPAK) 4 MG TBPK tablet, DISCONTINUED: triamcinolone lotion (KENALOG) 0.1 %, DISCONTINUED: fexofenadine (ALLEGRA ALLERGY) 60 MG tablet, DISCONTINUED: ALPRAZolam (XANAX) 0.25 MG tablet, DISCONTINUED: loratadine (CLARITIN) 10 MG tablet  Malignant neoplasm of upper-outer quadrant of left breast in female, estrogen receptor positive (HCC)   Urticaria: The patient was told to begin an Allegra 60 mg p.o. twice daily and continue taking Claritin 10 mg once daily.  She was also told to stop her Medrol Dosepak after her dosing today and was placed on a prednisolone taper.  She was given a prescription for Kenalog lotion and was given a refill of her Xanax which she has been taking for sleep.  ER positive malignant neoplasm of the left breast:  Courtney Williams continues with radiation therapy.  She has no follow-up appointment yet scheduled with Dr. Nicholas Lose.  I will make him aware of this.  Please see After Visit Summary for patient specific instructions.  Future Appointments  Date Time Provider [place]  03/17/2019 10:00 AM Hayden Pedro, PA-C [facility] None  03/17/2019  2:00 PM Kyung Rudd, MD [facility] None  07/04/2019  3:30 PM Regina Eck, CNM [place] None    No orders of the defined types were placed in this encounter.       Subjective:   Patient ID:  Courtney Williams is a 49 y.o. (DOB Jul 17, 1970) female.  Chief Complaint:  Chief Complaint  Patient presents with  . Rash    HPI ANANDI SIRNA  Is a 49 y.o. female with a diagnosis of an ER positive malignant neoplasm of the left breast.  She is followed by Dr. Nicholas Lose is status post adjuvant chemotherapy with Taxotere and Cytoxan with cycle 4 completed on 03/01/2019.  She is currently being treated with radiation therapy.  She presents to the clinic today with ongoing urticaria with erythematous and pruritic wheals over much of her body.  She first noticed these on Wednesday morning and contacted Dr. Nicholas Lose and was placed on a Medrol Dosepak.  Despite this she has shown no improvement.  She denies any change in personal hygiene products, medications, or food.  Medications: I have reviewed the patient's current medications.  Allergies: No Known Allergies  Past Medical History:  Diagnosis Date  . Anxiety   . Breast cancer ([place])    Left Breast  . Family history of CLL (chronic lymphoid leukemia)   . Hypoglycemia   . Hypothyroidism   . Migraines   . Thyroid disease    hypothyroid    Past Surgical History:  Procedure Laterality Date  . BREAST LUMPECTOMY WITH RADIOACTIVE SEED AND SENTINEL LYMPH NODE BIOPSY Left 11/10/2018   Procedure: LEFT BREAST LUMPECTOMY WITH RADIOACTIVE SEED AND LEFT AXILLARY SENTINEL LYMPH NODE BIOPSY;  Surgeon: Rolm Bookbinder, MD;  Location: [place];  Service: General;  Laterality: Left;  . NO PAST SURGERIES    . PORTACATH PLACEMENT Right 12/27/2018   Procedure: INSERTION PORT-A-CATH WITH  ULTRASOUND;  Surgeon: Rolm Bookbinder, MD;  Location: [place];  Service: General;  Laterality: Right;    Family History  Problem Relation Age of Onset  . Thyroid disease Father   . Leukemia Father 56       CLL  . Heart attack Maternal Aunt   . Heart disease Maternal Grandmother   . Breast cancer Neg Hx     Social  History   Socioeconomic History  . Marital status: Married    Spouse name: Not on file  . Number of children: Not on file  . Years of education: Not on file  . Highest education level: Not on file  Occupational History  . Not on file  Tobacco Use  . Smoking status: Current Some Day Smoker    Types: Cigarettes    Last attempt to quit: 01/21/2011    Years since quitting: 8.1  . Smokeless tobacco: Never Used  . Tobacco comment: cutting back, no cigarettes since 12/24/18  Substance and Sexual Activity  . Alcohol use: Yes    Alcohol/week: 20.0 standard drinks    Types: 20 Standard drinks or equivalent per week  . Drug use: Yes    Types: Marijuana    Comment: last used 12/26/18, smokes 1-2x per week  . Sexual activity: Yes    Partners: Female    Comment: same sex partner  Other Topics Concern  . Not on file  Social History Narrative  . Not on file   Social Determinants of Health   Financial Resource Strain:   . Difficulty of Paying Living Expenses: Not on file  Food Insecurity:   . Worried About Charity fundraiser in the Last Year: Not on file  . Ran Out of Food in the Last Year: Not on file  Transportation Needs:   . Lack of Transportation (Medical): Not on file  . Lack of Transportation (Non-Medical): Not on file  Physical Activity:   . Days of Exercise per Week: Not on file  . Minutes of Exercise per Session: Not on file  Stress:   . Feeling of Stress : Not on file  Social Connections:   . Frequency of Communication with Friends and Family: Not on file  . Frequency of Social Gatherings with Friends and Family: Not on file  . Attends Religious Services: Not on file  . Active Member of Clubs or Organizations: Not on file  . Attends Archivist Meetings: Not on file  . Marital Status: Not on file  Intimate Partner Violence:   . Fear of Current or Ex-Partner: Not on file  . Emotionally Abused: Not on file  . Physically Abused: Not on file  . Sexually Abused:  Not on file    Past Medical History, Surgical history, Social history, and Family history were reviewed and updated as appropriate.   Please see review of systems for further details on the patient's review from today.   Review of Systems:  Review of Systems  Constitutional: Negative for activity change, appetite change, chills, diaphoresis and fever.  HENT: Negative for trouble swallowing.   Skin: Positive for rash.    Objective:   Physical Exam:  BP 122/86 (BP Location: Right Arm, Patient Position: Sitting)   Pulse 89   Temp 98.9 F (37.2 C) (Temporal)   Resp 18   Ht 5' 1.75" (1.568 m)   Wt 176 lb 1.6 oz (79.9 kg)   SpO2 99%   BMI 32.47 kg/m  ECOG: 0  Physical  Exam Constitutional:      General: She is not in acute distress.    Appearance: Normal appearance. She is not ill-appearing.  HENT:     Head: Normocephalic and atraumatic.  Skin:    Findings: Erythema and rash present.     Comments: Erythematous wheals over the chest, bilateral upper extremities, and bilateral lower extremities.  Neurological:     Mental Status: She is alert.     Coordination: Coordination normal.     Gait: Gait normal.  Psychiatric:        Mood and Affect: Mood normal.        Behavior: Behavior normal.        Thought Content: Thought content normal.        Judgment: Judgment normal.     Lab Review:     Component Value Date/Time   NA 141 03/01/2019 0953   NA 141 07/02/2018 1645   K 3.6 03/01/2019 0953   CL 109 03/01/2019 0953   CO2 26 03/01/2019 0953   GLUCOSE 93 03/01/2019 0953   BUN 13 03/01/2019 0953   BUN 11 07/02/2018 1645   CREATININE 0.69 03/01/2019 0953   CREATININE 0.79 07/13/2015 1000   CALCIUM 8.5 (L) 03/01/2019 0953   PROT 6.4 (L) 03/01/2019 0953   PROT 6.7 07/02/2018 1645   ALBUMIN 3.8 03/01/2019 0953   ALBUMIN 4.4 07/02/2018 1645   AST 11 (L) 03/01/2019 0953   ALT 13 03/01/2019 0953   ALKPHOS 44 03/01/2019 0953   BILITOT 0.4 03/01/2019 0953   GFRNONAA >60  03/01/2019 0953   GFRAA >60 03/01/2019 0953       Component Value Date/Time   WBC 8.7 03/01/2019 0953   WBC 7.9 12/27/2018 1255   RBC 3.75 (L) 03/01/2019 0953   HGB 11.4 (L) 03/01/2019 0953   HGB 13.7 06/19/2017 1707   HGB 13.1 05/03/2013 1308   HCT 34.7 (L) 03/01/2019 0953   HCT 41.0 06/19/2017 1707   PLT 364 03/01/2019 0953   PLT 337 06/19/2017 1707   MCV 92.5 03/01/2019 0953   MCV 91 06/19/2017 1707   MCH 30.4 03/01/2019 0953   MCHC 32.9 03/01/2019 0953   RDW 15.7 (H) 03/01/2019 0953   RDW 14.3 06/19/2017 1707   LYMPHSABS 1.8 03/01/2019 0953   MONOABS 0.8 03/01/2019 0953   EOSABS 0.0 03/01/2019 0953   BASOSABS 0.0 03/01/2019 0953   -------------------------------  Imaging from last 24 hours (if applicable):  Radiology interpretation: No results found.

## 2019-03-15 ENCOUNTER — Ambulatory Visit: Payer: 59 | Admitting: Hematology and Oncology

## 2019-03-15 ENCOUNTER — Ambulatory Visit: Payer: 59

## 2019-03-15 ENCOUNTER — Other Ambulatory Visit: Payer: 59

## 2019-03-17 ENCOUNTER — Ambulatory Visit
Admission: RE | Admit: 2019-03-17 | Discharge: 2019-03-17 | Disposition: A | Discharge status: Home / Self Care | Payer: 59 | Source: Ambulatory Visit | Attending: Radiation Oncology | Admitting: Radiation Oncology

## 2019-03-17 ENCOUNTER — Other Ambulatory Visit: Payer: Self-pay

## 2019-03-17 DIAGNOSIS — C50412 Malignant neoplasm of upper-outer quadrant of left female breast: Secondary | ICD-10-CM | POA: Insufficient documentation

## 2019-03-17 DIAGNOSIS — Z170 Estrogen receptor positive status [ER+]: Secondary | ICD-10-CM | POA: Diagnosis not present

## 2019-03-17 NOTE — Progress Notes (Signed)
Radiation Oncology         (336) [phone] ________________________________   Name: Courtney Williams        MRN: [id number]  Date of Service: 03/17/2019 DOB: 06-10-1970  [id number], [name]  Nicholas Lose, MD     REFERRING PHYSICIAN: Nicholas Lose, MD   DIAGNOSIS: The encounter diagnosis was Malignant neoplasm of upper-outer quadrant of left breast in female, estrogen receptor positive ([place]).   HISTORY OF PRESENT ILLNESS: Courtney Williams is a 49 y.o. female originally seen in the multidisciplinary breast clinic for a new diagnosis of left breast cancer. The patient was noted to have a screening detected mass in the left breast.  On diagnostic imaging a mass at 2:00 in the upper outer quadrant measured 1.4 x 1.3 x 1 cm.  Her axilla was negative for adenopathy, and a biopsy on 10/20/2018 revealed a grade 2 invasive ductal carcinoma, her tumor was ER/PR positive, HER-2 was negative and her Ki-67 was 15%.  She proceeded with lumpectomy and sentinel node biopsy on 11/10/2018 and findings revealed a grade 2 invasive ductal carcinoma measuring 1.4 cm. She proceeded with lumpectomy of the left breast  on 11/10/2018, this revealed a grade 2 invasive ductal carcinoma measuring 1.4 cm. A single node was removed and a clinical node was also sampled with evidence of micrometastasis. Following her surgery, she had an Oncotype score of 22. She was counseled on the rationale for systemic chemotherapy and began this course on 12/28/2018. She completed her last chemotherapy infusion on 03/01/2019. She is seen today to discuss treatment recommendations for adjuvant radiotherapy.   PREVIOUS RADIATION THERAPY: No   PAST MEDICAL HISTORY:  Past Medical History:  Diagnosis Date  . Anxiety   . Breast cancer ([place])    Left Breast  . Family history of CLL (chronic lymphoid leukemia)   . Hypoglycemia   . Hypothyroidism   . Migraines   . Thyroid disease    hypothyroid       PAST SURGICAL HISTORY: Past Surgical History:    Procedure Laterality Date  . BREAST LUMPECTOMY WITH RADIOACTIVE SEED AND SENTINEL LYMPH NODE BIOPSY Left 11/10/2018   Procedure: LEFT BREAST LUMPECTOMY WITH RADIOACTIVE SEED AND LEFT AXILLARY SENTINEL LYMPH NODE BIOPSY;  Surgeon: Rolm Bookbinder, MD;  Location: [place];  Service: General;  Laterality: Left;  . NO PAST SURGERIES    . PORTACATH PLACEMENT Right 12/27/2018   Procedure: INSERTION PORT-A-CATH WITH ULTRASOUND;  Surgeon: Rolm Bookbinder, MD;  Location: [place];  Service: General;  Laterality: Right;     FAMILY HISTORY:  Family History  Problem Relation Age of Onset  . Thyroid disease Father   . Leukemia Father 16       CLL  . Heart attack Maternal Aunt   . Heart disease Maternal Grandmother   . Breast cancer Neg Hx      SOCIAL HISTORY:  reports that she has been smoking cigarettes. She has never used smokeless tobacco. She reports current alcohol use of about 20.0 standard drinks of alcohol per week. She reports current drug use. Drug: Marijuana. The patient is married and lives in [place]. Her wife Courtney Williams is able to join on the call as well.  She works at an [employer] firm and will be going back to work next week.  ALLERGIES: Patient has no known allergies.   MEDICATIONS:  Current Outpatient Medications  Medication Sig Dispense Refill  . ALPRAZolam (XANAX) 0.25 MG tablet Take 1 tablet (0.25 mg total)  by mouth 2 (two) times daily as needed for anxiety. 30 tablet 1  . dexamethasone (DECADRON) 4 MG tablet Take 1 tablet (4 mg total) by mouth daily. Take 1 tablet day before chemo and 1 tablet day after chemo with food 8 tablet 1  . fexofenadine (ALLEGRA ALLERGY) 60 MG tablet Take 1 tablet (60 mg total) by mouth 2 (two) times daily. 120 tablet 0  . fexofenadine (ALLEGRA) 180 MG tablet Take 180 mg by mouth daily as needed for allergies.     Marland Kitchen levothyroxine (SYNTHROID) 125 MCG tablet TAKE 1 TABLET (125 MCG TOTAL) BY MOUTH DAILY BEFORE BREAKFAST. 30 tablet  12  . lidocaine-prilocaine (EMLA) cream Apply to affected area once 30 g 3  . loratadine (CLARITIN) 10 MG tablet Take 1 tablet (10 mg total) by mouth daily. 30 tablet 0  . methylPREDNISolone (MEDROL DOSEPAK) 4 MG TBPK tablet Take as directed 21 tablet 0  . ondansetron (ZOFRAN) 8 MG tablet Take 1 tablet (8 mg total) by mouth 2 (two) times daily as needed for refractory nausea / vomiting. Start on day 3 after chemo. 30 tablet 1  . oxyCODONE (OXY IR/ROXICODONE) 5 MG immediate release tablet Take 1 tablet (5 mg total) by mouth every 6 (six) hours as needed for moderate pain, severe pain or breakthrough pain. 6 tablet 0  . prednisoLONE 5 MG TABS tablet 8 tab x 1 day, 7 tab x 1 day, 6 tab x 1 day, 5 tab x 1 day, 4 tab x 1 day, 3 tab x 1 day, 2 tab x 1 day, 1 tab x 1 day, stop 36 tablet 0  . prochlorperazine (COMPAZINE) 10 MG tablet Take 1 tablet (10 mg total) by mouth every 6 (six) hours as needed (Nausea or vomiting). 30 tablet 1  . rizatriptan (MAXALT) 10 MG tablet Take 1 tablet (10 mg total) by mouth as needed for migraine. May repeat in 2 hours if needed 10 tablet 0  . triamcinolone lotion (KENALOG) 0.1 % Apply 1 application topically 3 (three) times daily. 180 mL 1   No current facility-administered medications for this encounter.     REVIEW OF SYSTEMS: On review of systems, the patient reports that she is doing well overall. She states she is overwhelmed by all of what she's been through and is ready to "get this over with." She is hoping to have her right sided chest PAC removed soon.  She denies any chest pain, shortness of breath, cough, fevers, chills, night sweats, unintended weight changes. She denies any bowel or bladder disturbances, and denies abdominal pain, nausea or vomiting. She denies any new musculoskeletal or joint aches or pains. A complete review of systems is obtained and is otherwise negative.     PHYSICAL EXAM:  Wt Readings from Last 3 Encounters:  03/11/19 176 lb 1.6 oz  (79.9 kg)  03/01/19 175 lb 11.2 oz (79.7 kg)  02/08/19 172 lb 14.4 oz (78.4 kg)   Temp Readings from Last 3 Encounters:  03/11/19 98.9 F (37.2 C) (Temporal)  03/01/19 98.3 F (36.8 C) (Temporal)  02/08/19 98.9 F (37.2 C) (Temporal)   BP Readings from Last 3 Encounters:  03/11/19 122/86  03/01/19 121/86  02/08/19 104/70   Pulse Readings from Last 3 Encounters:  03/11/19 89  03/01/19 83  02/08/19 84   In general this is a well appearing Caucasian female in no acute distress.  She's alert and oriented x4 and appropriate throughout the examination. Cardiopulmonary assessment is negative for acute distress  and she exhibits normal effort.  Bilateral breast exam is deferred.   ECOG = 0  0 - Asymptomatic (Fully active, able to carry on all predisease activities without restriction)  1 - Symptomatic but completely ambulatory (Restricted in physically strenuous activity but ambulatory and able to carry out work of a light or sedentary nature. For example, light housework, office work)  2 - Symptomatic, <50% in bed during the day (Ambulatory and capable of all self care but unable to carry out any work activities. Up and about more than 50% of waking hours)  3 - Symptomatic, >50% in bed, but not bedbound (Capable of only limited self-care, confined to bed or chair 50% or more of waking hours)  4 - Bedbound (Completely disabled. Cannot carry on any self-care. Totally confined to bed or chair)  5 - Death   Courtney Williams MM, Courtney Williams, Courtney Williams, et al. [phone]). "Toxicity and response criteria of the [facility] Group". [place] Oncol. 5 (6): 649-55    LABORATORY DATA:  Lab Results  Component Value Date   WBC 8.7 03/01/2019   HGB 11.4 (L) 03/01/2019   HCT 34.7 (L) 03/01/2019   MCV 92.5 03/01/2019   PLT 364 03/01/2019   Lab Results  Component Value Date   NA 141 03/01/2019   K 3.6 03/01/2019   CL 109 03/01/2019   CO2 26 03/01/2019   Lab Results  Component  Value Date   ALT 13 03/01/2019   AST 11 (L) 03/01/2019   ALKPHOS 44 03/01/2019   BILITOT 0.4 03/01/2019      RADIOGRAPHY: No results found.     IMPRESSION/PLAN: 1. 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 Dr. MLisbeth Renshawdiscusses the pathology findings and reviews the nature of left breast disease. The patient has done well following surgery and with completing chemotherapy.  She would benefit from external radiotherapy to the left breast including high tangents to cover her micrometastatic node.  She would also be followed by medical oncology with antiestrogen therapy. We discussed the risks, benefits, short, and long term effects of radiotherapy, and the patient is interested in proceeding. Dr. MLisbeth Renshawdiscusses the delivery and logistics of radiotherapy and anticipates a course of 6 1/2  weeks of radiotherapy with deep inspiration breath-hold technique with high tangents to include her lower axillary nodes.  She will come in this afternoon for simulation at which time she will signed written consent to proceed. 2. Contraception. The patient is not planning on pregnancy and reports she is not at risk in her relationship. We will not need to have any urine pregnancy testing. 3.  In situ PAC. The patient has a right sided chest PAC and is trying to coordinate removal with Dr. WCristal Generousoffice. I've called to check in on the progress, and the scheduler has left a message for his nurse to place an order for removal.   This encounter was provided by telemedicine platform [employer].  The patient has provided two factor identification and has given verbal consent for this type of encounter and has been advised to only accept a meeting of this type in a secure network environment. The time spent during this encounter was 30 minutes including preparation, discussion, and coordination of the patient's care. The attendants for this meeting include Dr. MLisbeth Renshaw  AHayden Pedro and SDiamantina Monks Her wife TPriscille LovelessDuring the encounter, Dr. MLisbeth Renshaw and AHayden Pedrowere located at [facility]  [place] [facility].  DEVERY ODWYER was located at home her wife Courtney Williams was also on the call located at work.   The above documentation reflects my direct findings during this shared patient visit. Please see the separate note by Dr. Lisbeth Renshaw on this date for the remainder of the patient's plan of care.    Carola Rhine, PAC

## 2019-03-22 ENCOUNTER — Telehealth: Payer: Self-pay | Admitting: Hematology and Oncology

## 2019-03-22 ENCOUNTER — Encounter: Payer: Self-pay | Admitting: *Deleted

## 2019-03-22 DIAGNOSIS — C50412 Malignant neoplasm of upper-outer quadrant of left female breast: Secondary | ICD-10-CM | POA: Diagnosis present

## 2019-03-22 DIAGNOSIS — Z170 Estrogen receptor positive status [ER+]: Secondary | ICD-10-CM | POA: Diagnosis present

## 2019-03-22 NOTE — Telephone Encounter (Signed)
Scheduled appt per 3/2 sch msg. Pt is aware of appt date and time.

## 2019-03-24 ENCOUNTER — Ambulatory Visit
Admission: RE | Admit: 2019-03-24 | Discharge: 2019-03-24 | Disposition: A | Discharge status: Home / Self Care | Payer: 59 | Source: Ambulatory Visit | Attending: Radiation Oncology | Admitting: Radiation Oncology

## 2019-03-24 ENCOUNTER — Other Ambulatory Visit: Payer: Self-pay

## 2019-03-24 DIAGNOSIS — C50412 Malignant neoplasm of upper-outer quadrant of left female breast: Secondary | ICD-10-CM | POA: Diagnosis not present

## 2019-03-25 ENCOUNTER — Ambulatory Visit
Admission: RE | Admit: 2019-03-25 | Discharge: 2019-03-25 | Disposition: A | Discharge status: Home / Self Care | Payer: 59 | Source: Ambulatory Visit | Attending: Radiation Oncology | Admitting: Radiation Oncology

## 2019-03-25 ENCOUNTER — Other Ambulatory Visit: Payer: Self-pay

## 2019-03-25 DIAGNOSIS — C50412 Malignant neoplasm of upper-outer quadrant of left female breast: Secondary | ICD-10-CM

## 2019-03-25 MED ORDER — ALRA NON-METALLIC DEODORANT (RAD-ONC)
1.0000 "application " | Freq: Once | TOPICAL | Status: AC
  Administered 2019-03-25: 1 via TOPICAL

## 2019-03-25 MED ORDER — SONAFINE EX EMUL
1.0000 "application " | Freq: Once | CUTANEOUS | Status: AC
  Administered 2019-03-25: 1 via TOPICAL

## 2019-03-25 NOTE — Progress Notes (Signed)
Pt here for patient teaching.  Pt given Radiation and You booklet, skin care instructions, Alra deodorant and Sonafine.  Reviewed areas of pertinence such as fatigue, hair loss, skin changes, breast tenderness and breast swelling . Pt able to give teach back of to pat skin and use unscented/gentle soap,apply Sonafine bid, avoid applying anything to skin within 4 hours of treatment, avoid wearing an under wire bra and to use an electric razor if they must shave. Pt verbalizes understanding of information given and will contact nursing with any questions or concerns.     Deray Dawes M. Mychelle Kendra RN, BSN             

## 2019-03-28 ENCOUNTER — Ambulatory Visit
Admission: RE | Admit: 2019-03-28 | Discharge: 2019-03-28 | Disposition: A | Discharge status: Home / Self Care | Payer: 59 | Source: Ambulatory Visit | Attending: Radiation Oncology | Admitting: Radiation Oncology

## 2019-03-28 ENCOUNTER — Other Ambulatory Visit: Payer: Self-pay

## 2019-03-28 DIAGNOSIS — C50412 Malignant neoplasm of upper-outer quadrant of left female breast: Secondary | ICD-10-CM | POA: Diagnosis not present

## 2019-03-29 ENCOUNTER — Ambulatory Visit
Admission: RE | Admit: 2019-03-29 | Discharge: 2019-03-29 | Disposition: A | Discharge status: Home / Self Care | Payer: 59 | Source: Ambulatory Visit | Attending: Radiation Oncology | Admitting: Radiation Oncology

## 2019-03-29 ENCOUNTER — Other Ambulatory Visit: Payer: Self-pay

## 2019-03-29 DIAGNOSIS — C50412 Malignant neoplasm of upper-outer quadrant of left female breast: Secondary | ICD-10-CM | POA: Diagnosis not present

## 2019-03-30 ENCOUNTER — Ambulatory Visit
Admission: RE | Admit: 2019-03-30 | Discharge: 2019-03-30 | Disposition: A | Discharge status: Home / Self Care | Payer: 59 | Source: Ambulatory Visit | Attending: Radiation Oncology | Admitting: Radiation Oncology

## 2019-03-30 ENCOUNTER — Other Ambulatory Visit: Payer: Self-pay

## 2019-03-30 DIAGNOSIS — C50412 Malignant neoplasm of upper-outer quadrant of left female breast: Secondary | ICD-10-CM | POA: Diagnosis not present

## 2019-03-30 NOTE — Progress Notes (Signed)
  Radiation Oncology         (336) [phone] ________________________________  Name: Courtney Williams MRN: [id number]  Date: 03/17/2019  DOB: 16-Oct-1970  DIAGNOSIS:     ICD-10-CM   1. Malignant neoplasm of upper-outer quadrant of left breast in female, estrogen receptor positive ([place])  C50.412    Z17.0      SIMULATION AND TREATMENT PLANNING NOTE  The patient presented for simulation prior to beginning her course of radiation treatment for her diagnosis of left-sided breast cancer. The patient was placed in a supine position on a breast board. A customized vac-lock bag was constructed and this complex treatment device will be used on a daily basis during her treatment. In this fashion, a CT scan was obtained through the chest area and an isocenter was placed near the chest wall within the breast.  The patient will be planned to receive a course of radiation initially to a dose of 50.4 Gy. This will consist of a whole breast radiotherapy technique. To accomplish this, 2 customized blocks have been designed which will correspond to medial and lateral whole breast tangent fields. This treatment will be accomplished at 1.8 Gy per fraction. A forward planning technique will also be evaluated to determine if this approach improves the plan. It is anticipated that the patient will then receive a 10 Gy boost to the seroma cavity which has been contoured. This will be accomplished at 2 Gy per fraction.   This initial treatment will consist of a 3-D conformal technique. The seroma has been contoured as the primary target structure. Additionally, dose volume histograms of both this target as well as the lungs and heart will also be evaluated. Such an approach is necessary to ensure that the target area is adequately covered while the nearby critical  normal structures are adequately spared.  Plan:  The final anticipated total dose therefore will correspond to 60.4 Gy.   Special treatment procedure was performed  today due to the extra time and effort required by myself to plan and prepare this patient for deep inspiration breath hold technique.  I have determined cardiac sparing to be of benefit to this patient to prevent long term cardiac damage due to radiation of the heart.  Bellows were placed on the patient's abdomen. To facilitate cardiac sparing, the patient was coached by the radiation therapists on breath hold techniques and breathing practice was performed. Practice waveforms were obtained. The patient was then scanned while maintaining breath hold in the treatment position.  This image was then transferred over to the imaging specialist. The imaging specialist then created a fusion of the free breathing and breath hold scans using the chest wall as the stable structure. I personally reviewed the fusion in axial, coronal and sagittal image planes.  Excellent cardiac sparing was obtained.  I felt the patient is an appropriate candidate for breath hold and the patient will be treated as such.  The image fusion was then reviewed with the patient to reinforce the necessity of reproducible breath hold.     _______________________________   Jodelle Gross, MD, PhD

## 2019-03-30 NOTE — Progress Notes (Signed)
  Radiation Oncology         (336) [phone] ________________________________  Name: Courtney Williams MRN: [id number]  Date: 03/17/2019  DOB: Jun 19, 1970  Optical Surface Tracking Plan:  Since intensity modulated radiotherapy (IMRT) and 3D conformal radiation treatment methods are predicated on accurate and precise positioning for treatment, intrafraction motion monitoring is medically necessary to ensure accurate and safe treatment delivery.  The ability to quantify intrafraction motion without excessive ionizing radiation dose can only be performed with optical surface tracking. Accordingly, surface imaging offers the opportunity to obtain 3D measurements of patient position throughout IMRT and 3D treatments without excessive radiation exposure.  I am ordering optical surface tracking for this patient's upcoming course of radiotherapy. ________________________________  Kyung Rudd, MD 03/30/2019 1:30 PM    Reference:   Ursula Alert, J, et al. Surface imaging-based analysis of intrafraction motion for breast radiotherapy patients.Journal of [place], n. 6, nov. 2014. ISSN [id number].   Available at: <http://www.jacmp.org/index.php/jacmp/article/view/4957>.

## 2019-03-31 ENCOUNTER — Ambulatory Visit
Admission: RE | Admit: 2019-03-31 | Discharge: 2019-03-31 | Disposition: A | Discharge status: Home / Self Care | Payer: 59 | Source: Ambulatory Visit | Attending: Radiation Oncology | Admitting: Radiation Oncology

## 2019-03-31 ENCOUNTER — Other Ambulatory Visit: Payer: Self-pay

## 2019-03-31 DIAGNOSIS — C50412 Malignant neoplasm of upper-outer quadrant of left female breast: Secondary | ICD-10-CM | POA: Diagnosis not present

## 2019-04-01 ENCOUNTER — Ambulatory Visit
Admission: RE | Admit: 2019-04-01 | Discharge: 2019-04-01 | Disposition: A | Discharge status: Home / Self Care | Payer: 59 | Source: Ambulatory Visit | Attending: Radiation Oncology | Admitting: Radiation Oncology

## 2019-04-01 ENCOUNTER — Other Ambulatory Visit: Payer: Self-pay

## 2019-04-01 DIAGNOSIS — C50412 Malignant neoplasm of upper-outer quadrant of left female breast: Secondary | ICD-10-CM | POA: Diagnosis not present

## 2019-04-04 ENCOUNTER — Ambulatory Visit
Admission: RE | Admit: 2019-04-04 | Discharge: 2019-04-04 | Disposition: A | Discharge status: Home / Self Care | Payer: 59 | Source: Ambulatory Visit | Attending: Radiation Oncology | Admitting: Radiation Oncology

## 2019-04-04 ENCOUNTER — Other Ambulatory Visit: Payer: Self-pay

## 2019-04-04 DIAGNOSIS — Z923 Personal history of irradiation: Secondary | ICD-10-CM

## 2019-04-04 DIAGNOSIS — C50412 Malignant neoplasm of upper-outer quadrant of left female breast: Secondary | ICD-10-CM | POA: Diagnosis not present

## 2019-04-04 HISTORY — DX: Personal history of irradiation: Z92.3

## 2019-04-05 ENCOUNTER — Other Ambulatory Visit: Payer: Self-pay

## 2019-04-05 ENCOUNTER — Ambulatory Visit
Admission: RE | Admit: 2019-04-05 | Discharge: 2019-04-05 | Disposition: A | Discharge status: Home / Self Care | Payer: 59 | Source: Ambulatory Visit | Attending: Radiation Oncology | Admitting: Radiation Oncology

## 2019-04-05 DIAGNOSIS — C50412 Malignant neoplasm of upper-outer quadrant of left female breast: Secondary | ICD-10-CM | POA: Diagnosis not present

## 2019-04-06 ENCOUNTER — Other Ambulatory Visit: Payer: Self-pay

## 2019-04-06 ENCOUNTER — Ambulatory Visit
Admission: RE | Admit: 2019-04-06 | Discharge: 2019-04-06 | Disposition: A | Discharge status: Home / Self Care | Payer: 59 | Source: Ambulatory Visit | Attending: Radiation Oncology | Admitting: Radiation Oncology

## 2019-04-06 DIAGNOSIS — C50412 Malignant neoplasm of upper-outer quadrant of left female breast: Secondary | ICD-10-CM | POA: Diagnosis not present

## 2019-04-07 ENCOUNTER — Other Ambulatory Visit: Payer: Self-pay

## 2019-04-07 ENCOUNTER — Ambulatory Visit
Admission: RE | Admit: 2019-04-07 | Discharge: 2019-04-07 | Disposition: A | Discharge status: Home / Self Care | Payer: 59 | Source: Ambulatory Visit | Attending: Radiation Oncology | Admitting: Radiation Oncology

## 2019-04-07 DIAGNOSIS — C50412 Malignant neoplasm of upper-outer quadrant of left female breast: Secondary | ICD-10-CM | POA: Diagnosis not present

## 2019-04-08 ENCOUNTER — Ambulatory Visit
Admission: RE | Admit: 2019-04-08 | Discharge: 2019-04-08 | Disposition: A | Discharge status: Home / Self Care | Payer: 59 | Source: Ambulatory Visit | Attending: Radiation Oncology | Admitting: Radiation Oncology

## 2019-04-08 ENCOUNTER — Other Ambulatory Visit: Payer: Self-pay

## 2019-04-08 DIAGNOSIS — C50412 Malignant neoplasm of upper-outer quadrant of left female breast: Secondary | ICD-10-CM | POA: Diagnosis not present

## 2019-04-11 ENCOUNTER — Other Ambulatory Visit: Payer: Self-pay

## 2019-04-11 ENCOUNTER — Ambulatory Visit
Admission: RE | Admit: 2019-04-11 | Discharge: 2019-04-11 | Disposition: A | Discharge status: Home / Self Care | Payer: 59 | Source: Ambulatory Visit | Attending: Radiation Oncology | Admitting: Radiation Oncology

## 2019-04-11 DIAGNOSIS — C50412 Malignant neoplasm of upper-outer quadrant of left female breast: Secondary | ICD-10-CM | POA: Diagnosis not present

## 2019-04-12 ENCOUNTER — Other Ambulatory Visit: Payer: Self-pay

## 2019-04-12 ENCOUNTER — Ambulatory Visit
Admission: RE | Admit: 2019-04-12 | Discharge: 2019-04-12 | Disposition: A | Discharge status: Home / Self Care | Payer: 59 | Source: Ambulatory Visit | Attending: Radiation Oncology | Admitting: Radiation Oncology

## 2019-04-12 DIAGNOSIS — C50412 Malignant neoplasm of upper-outer quadrant of left female breast: Secondary | ICD-10-CM | POA: Diagnosis not present

## 2019-04-13 ENCOUNTER — Other Ambulatory Visit: Payer: Self-pay

## 2019-04-13 ENCOUNTER — Ambulatory Visit
Admission: RE | Admit: 2019-04-13 | Discharge: 2019-04-13 | Disposition: A | Discharge status: Home / Self Care | Payer: 59 | Source: Ambulatory Visit | Attending: Radiation Oncology | Admitting: Radiation Oncology

## 2019-04-13 ENCOUNTER — Encounter: Payer: Self-pay | Admitting: Certified Nurse Midwife

## 2019-04-13 DIAGNOSIS — C50412 Malignant neoplasm of upper-outer quadrant of left female breast: Secondary | ICD-10-CM | POA: Diagnosis not present

## 2019-04-14 ENCOUNTER — Other Ambulatory Visit: Payer: Self-pay

## 2019-04-14 ENCOUNTER — Ambulatory Visit
Admission: RE | Admit: 2019-04-14 | Discharge: 2019-04-14 | Disposition: A | Discharge status: Home / Self Care | Payer: 59 | Source: Ambulatory Visit | Attending: Radiation Oncology | Admitting: Radiation Oncology

## 2019-04-14 DIAGNOSIS — C50412 Malignant neoplasm of upper-outer quadrant of left female breast: Secondary | ICD-10-CM | POA: Diagnosis not present

## 2019-04-15 ENCOUNTER — Ambulatory Visit
Admission: RE | Admit: 2019-04-15 | Discharge: 2019-04-15 | Disposition: A | Discharge status: Home / Self Care | Payer: 59 | Source: Ambulatory Visit | Attending: Radiation Oncology | Admitting: Radiation Oncology

## 2019-04-15 ENCOUNTER — Other Ambulatory Visit: Payer: Self-pay

## 2019-04-15 DIAGNOSIS — C50412 Malignant neoplasm of upper-outer quadrant of left female breast: Secondary | ICD-10-CM | POA: Diagnosis not present

## 2019-04-18 ENCOUNTER — Other Ambulatory Visit: Payer: Self-pay

## 2019-04-18 ENCOUNTER — Ambulatory Visit
Admission: RE | Admit: 2019-04-18 | Discharge: 2019-04-18 | Disposition: A | Discharge status: Home / Self Care | Payer: 59 | Source: Ambulatory Visit | Attending: Radiation Oncology | Admitting: Radiation Oncology

## 2019-04-18 DIAGNOSIS — C50412 Malignant neoplasm of upper-outer quadrant of left female breast: Secondary | ICD-10-CM | POA: Diagnosis not present

## 2019-04-19 ENCOUNTER — Ambulatory Visit
Admission: RE | Admit: 2019-04-19 | Discharge: 2019-04-19 | Disposition: A | Discharge status: Home / Self Care | Payer: 59 | Source: Ambulatory Visit | Attending: Radiation Oncology | Admitting: Radiation Oncology

## 2019-04-19 ENCOUNTER — Other Ambulatory Visit: Payer: Self-pay

## 2019-04-19 DIAGNOSIS — C50412 Malignant neoplasm of upper-outer quadrant of left female breast: Secondary | ICD-10-CM | POA: Diagnosis not present

## 2019-04-20 ENCOUNTER — Other Ambulatory Visit: Payer: Self-pay

## 2019-04-20 ENCOUNTER — Ambulatory Visit
Admission: RE | Admit: 2019-04-20 | Discharge: 2019-04-20 | Disposition: A | Discharge status: Home / Self Care | Payer: 59 | Source: Ambulatory Visit | Attending: Radiation Oncology | Admitting: Radiation Oncology

## 2019-04-20 DIAGNOSIS — C50412 Malignant neoplasm of upper-outer quadrant of left female breast: Secondary | ICD-10-CM | POA: Diagnosis not present

## 2019-04-21 ENCOUNTER — Other Ambulatory Visit: Payer: Self-pay

## 2019-04-21 ENCOUNTER — Ambulatory Visit
Admission: RE | Admit: 2019-04-21 | Discharge: 2019-04-21 | Disposition: A | Discharge status: Home / Self Care | Payer: 59 | Source: Ambulatory Visit | Attending: Radiation Oncology | Admitting: Radiation Oncology

## 2019-04-21 DIAGNOSIS — C50412 Malignant neoplasm of upper-outer quadrant of left female breast: Secondary | ICD-10-CM | POA: Insufficient documentation

## 2019-04-21 DIAGNOSIS — Z170 Estrogen receptor positive status [ER+]: Secondary | ICD-10-CM | POA: Diagnosis present

## 2019-04-22 ENCOUNTER — Ambulatory Visit
Admission: RE | Admit: 2019-04-22 | Discharge: 2019-04-22 | Disposition: A | Discharge status: Home / Self Care | Payer: 59 | Source: Ambulatory Visit | Attending: Radiation Oncology | Admitting: Radiation Oncology

## 2019-04-22 ENCOUNTER — Other Ambulatory Visit: Payer: Self-pay

## 2019-04-22 ENCOUNTER — Ambulatory Visit: Payer: 59 | Attending: Internal Medicine

## 2019-04-22 DIAGNOSIS — C50412 Malignant neoplasm of upper-outer quadrant of left female breast: Secondary | ICD-10-CM | POA: Diagnosis not present

## 2019-04-22 DIAGNOSIS — Z23 Encounter for immunization: Secondary | ICD-10-CM

## 2019-04-22 NOTE — Progress Notes (Signed)
   Covid-19 Vaccination Clinic  Name:  Courtney Williams    MRN: [id number] DOB: 1970-12-22  04/22/2019  Courtney Williams was observed post Covid-19 immunization for 15 minutes without incident. She was provided with Vaccine Information Sheet and instruction to access the V-Safe system.   Courtney Williams was instructed to call 911 with any severe reactions post vaccine: Marland Kitchen Difficulty breathing  . Swelling of face and throat  . A fast heartbeat  . A bad rash all over body  . Dizziness and weakness   Immunizations Administered    Name Date Dose VIS Date Route   Pfizer COVID-19 Vaccine 04/22/2019  2:13 PM 0.3 mL 12/31/2018 Intramuscular   Manufacturer: [employer], [employer]   Lot: [id number]   [place]: [id number]

## 2019-04-25 ENCOUNTER — Other Ambulatory Visit: Payer: Self-pay

## 2019-04-25 ENCOUNTER — Ambulatory Visit
Admission: RE | Admit: 2019-04-25 | Discharge: 2019-04-25 | Disposition: A | Discharge status: Home / Self Care | Payer: 59 | Source: Ambulatory Visit | Attending: Radiation Oncology | Admitting: Radiation Oncology

## 2019-04-25 DIAGNOSIS — C50412 Malignant neoplasm of upper-outer quadrant of left female breast: Secondary | ICD-10-CM | POA: Diagnosis not present

## 2019-04-26 ENCOUNTER — Other Ambulatory Visit: Payer: Self-pay

## 2019-04-26 ENCOUNTER — Ambulatory Visit
Admission: RE | Admit: 2019-04-26 | Discharge: 2019-04-26 | Disposition: A | Discharge status: Home / Self Care | Payer: 59 | Source: Ambulatory Visit | Attending: Radiation Oncology | Admitting: Radiation Oncology

## 2019-04-26 DIAGNOSIS — C50412 Malignant neoplasm of upper-outer quadrant of left female breast: Secondary | ICD-10-CM | POA: Diagnosis not present

## 2019-04-27 ENCOUNTER — Other Ambulatory Visit: Payer: Self-pay

## 2019-04-27 ENCOUNTER — Ambulatory Visit
Admission: RE | Admit: 2019-04-27 | Discharge: 2019-04-27 | Disposition: A | Discharge status: Home / Self Care | Payer: 59 | Source: Ambulatory Visit | Attending: Radiation Oncology | Admitting: Radiation Oncology

## 2019-04-27 DIAGNOSIS — C50412 Malignant neoplasm of upper-outer quadrant of left female breast: Secondary | ICD-10-CM | POA: Diagnosis not present

## 2019-04-28 ENCOUNTER — Other Ambulatory Visit: Payer: Self-pay

## 2019-04-28 ENCOUNTER — Ambulatory Visit
Admission: RE | Admit: 2019-04-28 | Discharge: 2019-04-28 | Disposition: A | Discharge status: Home / Self Care | Payer: 59 | Source: Ambulatory Visit | Attending: Radiation Oncology | Admitting: Radiation Oncology

## 2019-04-28 DIAGNOSIS — C50412 Malignant neoplasm of upper-outer quadrant of left female breast: Secondary | ICD-10-CM | POA: Diagnosis not present

## 2019-04-29 ENCOUNTER — Ambulatory Visit
Admission: RE | Admit: 2019-04-29 | Discharge: 2019-04-29 | Disposition: A | Discharge status: Home / Self Care | Payer: 59 | Source: Ambulatory Visit | Attending: Radiation Oncology | Admitting: Radiation Oncology

## 2019-04-29 ENCOUNTER — Other Ambulatory Visit: Payer: Self-pay

## 2019-04-29 ENCOUNTER — Ambulatory Visit: Payer: 59 | Admitting: Radiation Oncology

## 2019-04-29 DIAGNOSIS — C50412 Malignant neoplasm of upper-outer quadrant of left female breast: Secondary | ICD-10-CM | POA: Diagnosis not present

## 2019-05-02 ENCOUNTER — Other Ambulatory Visit: Payer: Self-pay

## 2019-05-02 ENCOUNTER — Ambulatory Visit
Admission: RE | Admit: 2019-05-02 | Discharge: 2019-05-02 | Disposition: A | Discharge status: Home / Self Care | Payer: 59 | Source: Ambulatory Visit | Attending: Radiation Oncology | Admitting: Radiation Oncology

## 2019-05-02 DIAGNOSIS — C50412 Malignant neoplasm of upper-outer quadrant of left female breast: Secondary | ICD-10-CM | POA: Diagnosis not present

## 2019-05-03 ENCOUNTER — Ambulatory Visit
Admission: RE | Admit: 2019-05-03 | Discharge: 2019-05-03 | Disposition: A | Discharge status: Home / Self Care | Payer: 59 | Source: Ambulatory Visit | Attending: Radiation Oncology | Admitting: Radiation Oncology

## 2019-05-03 ENCOUNTER — Other Ambulatory Visit: Payer: Self-pay

## 2019-05-03 DIAGNOSIS — C50412 Malignant neoplasm of upper-outer quadrant of left female breast: Secondary | ICD-10-CM | POA: Diagnosis not present

## 2019-05-04 ENCOUNTER — Ambulatory Visit
Admission: RE | Admit: 2019-05-04 | Discharge: 2019-05-04 | Disposition: A | Discharge status: Home / Self Care | Payer: 59 | Source: Ambulatory Visit | Attending: Radiation Oncology | Admitting: Radiation Oncology

## 2019-05-04 ENCOUNTER — Other Ambulatory Visit: Payer: Self-pay

## 2019-05-04 DIAGNOSIS — C50412 Malignant neoplasm of upper-outer quadrant of left female breast: Secondary | ICD-10-CM | POA: Diagnosis not present

## 2019-05-04 NOTE — Progress Notes (Signed)
Patient Care Team: Patient, [name] as PCP - General (General Practice) Rockwell Germany, RN as Oncology Nurse Navigator Mauro Kaufmann, RN as Oncology Nurse Navigator Rolm Bookbinder, MD as Consulting Physician (General Surgery) Nicholas Lose, MD as Consulting Physician (Hematology and Oncology)  DIAGNOSIS:    ICD-10-CM   1. Malignant neoplasm of upper-outer quadrant of left breast in female, estrogen receptor positive ([place])  C50.412    Z17.0     SUMMARY OF ONCOLOGIC HISTORY: Oncology History  Malignant neoplasm of upper-outer quadrant of left breast in female, estrogen receptor positive ([place])  10/22/2018 Initial Diagnosis   Routine screening mammogram showed a 1.4cm mass in the left breast at the 2:00 position with no left axillary adenopathy. Biopsy showed IDC, grade 2, HER-2 equivocal (2+), ER+ 80%, PR+ 10%, Ki67 15%.   10/27/2018 Cancer Staging   Staging form: Breast, AJCC 8th Edition - Clinical stage from 10/27/2018: Stage IA (cT1c, cN0, cM0, G2, ER+, PR+, HER2-) - Signed by Nicholas Lose, MD on 10/27/2018   11/05/2018 Genetic Testing   Negative genetic testing: no pathogenic variants detected on the [facility] Breast Cancer STAT panel and Common Hereditary Cancers Panel. The report date is 11/05/2018.  The Breast Cancer STAT panel offered by [facility] includes sequencing and rearrangement analysis for the following 9 genes:  ATM, BRCA1, BRCA2, CDH1, CHEK2, PALB2, PTEN, STK11 and TP53.  The Common Hereditary Gene Panel offered by [facility] includes sequencing and/or deletion duplication testing of the following 48 genes: APC, ATM, AXIN2, BARD1, BMPR1A, BRCA1, BRCA2, BRIP1, CDH1, CDK4, CDKN2A (p14ARF), CDKN2A (p16INK4a), CHEK2, CTNNA1, DICER1, EPCAM (Deletion/duplication testing only), GREM1 (promoter region deletion/duplication testing only), KIT, MEN1, MLH1, MSH2, MSH3, MSH6, MUTYH, NBN, NF1, NHTL1, PALB2, PDGFRA, PMS2, POLD1, POLE, PTEN, RAD50, RAD51C, RAD51D, RNF43, SDHB, SDHC,  SDHD, SMAD4, SMARCA4. STK11, TP53, TSC1, TSC2, and VHL.  The following genes were evaluated for sequence changes only: SDHA and HOXB13 c.251G>A variant only.   11/10/2018 Surgery   Left lumpectomy [name]): IDC, grade 2, 1.4cm, clear margins, and micrometastasis in 1 left axillary lymph node.   11/30/2018 Oncotype testing   Score of 22, 18% chance of distant recurrence in 9 years without systemic treatment.   12/28/2018 -  Chemotherapy   The patient had palonosetron (ALOXI) injection 0.25 mg, 0.25 mg, Intravenous,  Once, 4 of 4 cycles Administration: 0.25 mg (12/28/2018), 0.25 mg (01/18/2019), 0.25 mg (02/08/2019), 0.25 mg (03/01/2019) pegfilgrastim (NEULASTA ONPRO KIT) injection 6 mg, 6 mg, Subcutaneous, Once, 4 of 4 cycles Administration: 6 mg (12/28/2018), 6 mg (01/18/2019), 6 mg (02/08/2019), 6 mg (03/01/2019) cyclophosphamide (CYTOXAN) 1,100 mg in sodium chloride 0.9 % 250 mL chemo infusion, 600 mg/m2 = 1,100 mg, Intravenous,  Once, 4 of 4 cycles Administration: 1,100 mg (12/28/2018), 1,100 mg (01/18/2019), 1,100 mg (02/08/2019), 1,100 mg (03/01/2019) DOCEtaxel (TAXOTERE) 140 mg in sodium chloride 0.9 % 250 mL chemo infusion, 75 mg/m2 = 140 mg, Intravenous,  Once, 4 of 4 cycles Administration: 140 mg (12/28/2018), 140 mg (01/18/2019), 140 mg (02/08/2019), 140 mg (03/01/2019)  for chemotherapy treatment.    03/25/2019 -  Radiation Therapy   Adjuvant radiation     CHIEF COMPLIANT: Follow-up of left breast cancer to discuss antiestrogen therapy  INTERVAL HISTORY: Courtney Williams is a 49 y.o. with above-mentioned history of left breast cancer who underwent a lumpectomy, adjuvant chemotherapy, and is currently on radiation. She presents to the clinic todayto discuss antiestrogen therapy.   ALLERGIES:  has No Known Allergies.  MEDICATIONS:  Current Outpatient Medications  Medication Sig Dispense Refill  . ALPRAZolam (XANAX) 0.25 MG tablet Take 1 tablet (0.25 mg total) by mouth 2 (two) times daily as  needed for anxiety. 30 tablet 1  . dexamethasone (DECADRON) 4 MG tablet Take 1 tablet (4 mg total) by mouth daily. Take 1 tablet day before chemo and 1 tablet day after chemo with food (Patient not taking: Reported on 03/17/2019) 8 tablet 1  . fexofenadine (ALLEGRA ALLERGY) 60 MG tablet Take 1 tablet (60 mg total) by mouth 2 (two) times daily. (Patient not taking: Reported on 03/17/2019) 120 tablet 0  . fexofenadine (ALLEGRA) 180 MG tablet Take 180 mg by mouth daily as needed for allergies.     Marland Kitchen levothyroxine (SYNTHROID) 125 MCG tablet TAKE 1 TABLET (125 MCG TOTAL) BY MOUTH DAILY BEFORE BREAKFAST. 30 tablet 12  . lidocaine-prilocaine (EMLA) cream Apply to affected area once (Patient not taking: Reported on 03/17/2019) 30 g 3  . loratadine (CLARITIN) 10 MG tablet Take 1 tablet (10 mg total) by mouth daily. (Patient not taking: Reported on 03/17/2019) 30 tablet 0  . methylPREDNISolone (MEDROL DOSEPAK) 4 MG TBPK tablet Take as directed (Patient not taking: Reported on 03/17/2019) 21 tablet 0  . ondansetron (ZOFRAN) 8 MG tablet Take 1 tablet (8 mg total) by mouth 2 (two) times daily as needed for refractory nausea / vomiting. Start on day 3 after chemo. (Patient not taking: Reported on 03/17/2019) 30 tablet 1  . oxyCODONE (OXY IR/ROXICODONE) 5 MG immediate release tablet Take 1 tablet (5 mg total) by mouth every 6 (six) hours as needed for moderate pain, severe pain or breakthrough pain. (Patient not taking: Reported on 03/17/2019) 6 tablet 0  . prednisoLONE 5 MG TABS tablet 8 tab x 1 day, 7 tab x 1 day, 6 tab x 1 day, 5 tab x 1 day, 4 tab x 1 day, 3 tab x 1 day, 2 tab x 1 day, 1 tab x 1 day, stop (Patient not taking: Reported on 03/17/2019) 36 tablet 0  . prochlorperazine (COMPAZINE) 10 MG tablet Take 1 tablet (10 mg total) by mouth every 6 (six) hours as needed (Nausea or vomiting). (Patient not taking: Reported on 03/17/2019) 30 tablet 1  . rizatriptan (MAXALT) 10 MG tablet Take 1 tablet (10 mg total) by mouth as  needed for migraine. May repeat in 2 hours if needed (Patient not taking: Reported on 03/17/2019) 10 tablet 0  . triamcinolone lotion (KENALOG) 0.1 % Apply 1 application topically 3 (three) times daily. (Patient not taking: Reported on 03/17/2019) 180 mL 1   No current facility-administered medications for this visit.    PHYSICAL EXAMINATION: ECOG PERFORMANCE STATUS: 1 - Symptomatic but completely ambulatory  Vitals:   05/05/19 1506  BP: 132/80  Pulse: 69  Resp: 18  Temp: 98.2 F (36.8 C)  SpO2: 100%   Filed Weights   05/05/19 1506  Weight: 177 lb 12.8 oz (80.6 kg)    LABORATORY DATA:  I have reviewed the data as listed CMP Latest Ref Rng & Units 03/01/2019 02/08/2019 01/18/2019  Glucose 70 - 99 mg/dL 93 104(H) 90  BUN 6 - 20 mg/dL _0 Creatinine 0.44 - 1.00 mg/dL 0.69 0.73 0.69  Sodium 135 - 145 mmol/L 141 140 142  Potassium 3.5 - 5.1 mmol/L 3.6 4.1 3.7  Chloride 98 - 111 mmol/L 109 106 107  CO2 22 - 32 mmol/L _1 Calcium 8.9 - 10.3 mg/dL 8.5(L) 8.5(L) 8.7(L)  Total Protein 6.5 - 8.1  g/dL 6.4(L) 6.6 6.6  Total Bilirubin 0.3 - 1.2 mg/dL 0.4 0.4 0.4  Alkaline Phos 38 - 126 U/L 44 50 46  AST 15 - 41 U/L 11(L) 9(L) 7(L)  ALT 0 - 44 U/L _0 Lab Results  Component Value Date   WBC 8.7 03/01/2019   HGB 11.4 (L) 03/01/2019   HCT 34.7 (L) 03/01/2019   MCV 92.5 03/01/2019   PLT 364 03/01/2019   NEUTROABS 6.0 03/01/2019    ASSESSMENT & PLAN:  Malignant neoplasm of upper-outer quadrant of left breast in female, estrogen receptor positive ([place]) 10/22/18:Routine screening mammogram showed a 1.4cm mass in the left breast at the 2:00 position with no left axillary adenopathy. Biopsy showed IDC, grade 2, HER-2 equivocal (2+), ER+ 80%, PR+ 10%, Ki6715%.  11/10/2018:Left lumpectomy [name]): IDC, grade 2, 1.4cm, clear margins, and micrometastasis in 1 left axillary lymph node.ER 80%, PR 10%, Ki-67 15%, HER-2 equivocal 2+ by IHC but FISH negative  Oncotype  DX score: 22 distant recurrence at 9 years with hormone therapy alone 18%  Recommendation: 1. Adjuvant chemotherapy Taxotere and Cytoxan every 3 weeks x4 cycles completed 03/01/2019 2. Adj XRT completed 05/05/2019 3. Adj Anti-estrogen therapy ----------------------------------------------------------------------------------------------------------------------------------------------------------- Treatment plan: Adjuvant antiestrogen therapy with tamoxifen 20 mg daily x10 years vs anastrozole 1 mg daily X 7 years  Send [facility] and estradiol to see if shes in menopause If she is in menopause I will send a prescription for anastrozole. I will call her with results of this test to discuss starting antiestrogen therapy. Financial toxicity: Unfortunately patient has had calls from that collectors to pay some of the bills.  Return to clinic in 6 months for survivorship care plan visit.  No orders of the defined types were placed in this encounter.  The patient has a good understanding of the overall plan. she agrees with it. she will call with any problems that may develop before the next visit here.  Total time spent: 30 mins including face to face time and time spent for planning, charting and coordination of care  Nicholas Lose, MD 05/05/2019  I, Cloyde Reams Dorshimer, am acting as scribe for Dr. Nicholas Lose.  I have reviewed the above documentation for accuracy and completeness, and I agree with the above.

## 2019-05-05 ENCOUNTER — Inpatient Hospital Stay: Payer: 59 | Attending: Hematology and Oncology | Admitting: Hematology and Oncology

## 2019-05-05 ENCOUNTER — Inpatient Hospital Stay: Payer: 59

## 2019-05-05 ENCOUNTER — Other Ambulatory Visit: Payer: Self-pay

## 2019-05-05 ENCOUNTER — Ambulatory Visit
Admission: RE | Admit: 2019-05-05 | Discharge: 2019-05-05 | Disposition: A | Discharge status: Home / Self Care | Payer: 59 | Source: Ambulatory Visit | Attending: Radiation Oncology | Admitting: Radiation Oncology

## 2019-05-05 DIAGNOSIS — Z923 Personal history of irradiation: Secondary | ICD-10-CM | POA: Insufficient documentation

## 2019-05-05 DIAGNOSIS — L509 Urticaria, unspecified: Secondary | ICD-10-CM | POA: Diagnosis not present

## 2019-05-05 DIAGNOSIS — Z7952 Long term (current) use of systemic steroids: Secondary | ICD-10-CM | POA: Diagnosis not present

## 2019-05-05 DIAGNOSIS — Z170 Estrogen receptor positive status [ER+]: Secondary | ICD-10-CM | POA: Diagnosis not present

## 2019-05-05 DIAGNOSIS — C50412 Malignant neoplasm of upper-outer quadrant of left female breast: Secondary | ICD-10-CM | POA: Diagnosis not present

## 2019-05-05 DIAGNOSIS — Z79899 Other long term (current) drug therapy: Secondary | ICD-10-CM | POA: Insufficient documentation

## 2019-05-05 LAB — CBC WITH DIFFERENTIAL (CANCER CENTER ONLY)
Abs Immature Granulocytes: 0.01 10*3/uL (ref 0.00–0.07)
Basophils Absolute: 0 10*3/uL (ref 0.0–0.1)
Basophils Relative: 1 %
Eosinophils Absolute: 0.2 10*3/uL (ref 0.0–0.5)
Eosinophils Relative: 4 %
HCT: 41.1 % (ref 36.0–46.0)
Hemoglobin: 13.6 g/dL (ref 12.0–15.0)
Immature Granulocytes: 0 %
Lymphocytes Relative: 25 %
Lymphs Abs: 1.4 10*3/uL (ref 0.7–4.0)
MCH: 30.8 pg (ref 26.0–34.0)
MCHC: 33.1 g/dL (ref 30.0–36.0)
MCV: 93 fL (ref 80.0–100.0)
Monocytes Absolute: 0.5 10*3/uL (ref 0.1–1.0)
Monocytes Relative: 9 %
Neutro Abs: 3.5 10*3/uL (ref 1.7–7.7)
Neutrophils Relative %: 61 %
Platelet Count: 305 10*3/uL (ref 150–400)
RBC: 4.42 MIL/uL (ref 3.87–5.11)
RDW: 13.2 % (ref 11.5–15.5)
WBC Count: 5.7 10*3/uL (ref 4.0–10.5)
nRBC: 0 % (ref 0.0–0.2)

## 2019-05-05 LAB — CMP (CANCER CENTER ONLY)
ALT: 13 U/L (ref 0–44)
AST: 13 U/L — ABNORMAL LOW (ref 15–41)
Albumin: 4 g/dL (ref 3.5–5.0)
Alkaline Phosphatase: 45 U/L (ref 38–126)
Anion gap: 8 (ref 5–15)
BUN: 10 mg/dL (ref 6–20)
CO2: 26 mmol/L (ref 22–32)
Calcium: 9.2 mg/dL (ref 8.9–10.3)
Chloride: 106 mmol/L (ref 98–111)
Creatinine: 0.74 mg/dL (ref 0.44–1.00)
GFR, Est AFR Am: >60 mL/min (ref >60)
GFR, Estimated: >60 mL/min (ref >60)
Glucose, Bld: 90 mg/dL (ref 70–99)
Potassium: 3.9 mmol/L (ref 3.5–5.1)
Sodium: 140 mmol/L (ref 135–145)
Total Bilirubin: 0.3 mg/dL (ref 0.3–1.2)
Total Protein: 7.2 g/dL (ref 6.5–8.1)

## 2019-05-05 MED ORDER — ALPRAZOLAM 0.25 MG PO TABS: 0.2500 mg | ORAL_TABLET | Freq: Two times a day (BID) | ORAL | 1 refills | Status: DC | PRN

## 2019-05-05 NOTE — Assessment & Plan Note (Signed)
10/22/18:Routine screening mammogram showed a 1.4cm mass in the left breast at the 2:00 position with no left axillary adenopathy. Biopsy showed IDC, grade 2, HER-2 equivocal (2+), ER+ 80%, PR+ 10%, Ki6715%.  11/10/2018:Left lumpectomy [name]): IDC, grade 2, 1.4cm, clear margins, and micrometastasis in 1 left axillary lymph node.ER 80%, PR 10%, Ki-67 15%, HER-2 equivocal 2+ by IHC but FISH negative  Oncotype DX score: 22 distant recurrence at 9 years with hormone therapy alone 18%  Recommendation: 1. Adjuvant chemotherapy Taxotere and Cytoxan every 3 weeks x4 cycles completed 03/01/2019 2. Adj XRT completed 05/05/2019 3. Adj Anti-estrogen therapy ----------------------------------------------------------------------------------------------------------------------------------------------------------- Treatment plan: Adjuvant antiestrogen therapy with tamoxifen 20 mg daily x10 years Tamoxifen counseling:We discussed the risks and benefits of tamoxifen. These include but not limited to insomnia, hot flashes, mood changes, vaginal dryness, and weight gain. Although rare, serious side effects including endometrial cancer, risk of blood clots were also discussed. We strongly believe that the benefits far outweigh the risks. Patient understands these risks and consented to starting treatment. Planned treatment duration is 10 years.  Return to clinic in 3 months for survivorship care plan visit.

## 2019-05-06 ENCOUNTER — Ambulatory Visit
Admission: RE | Admit: 2019-05-06 | Discharge: 2019-05-06 | Disposition: A | Discharge status: Home / Self Care | Payer: 59 | Source: Ambulatory Visit | Attending: Radiation Oncology | Admitting: Radiation Oncology

## 2019-05-06 DIAGNOSIS — C50412 Malignant neoplasm of upper-outer quadrant of left female breast: Secondary | ICD-10-CM | POA: Diagnosis not present

## 2019-05-06 LAB — FOLLICLE STIMULATING HORMONE: FSH: 81.9 m[IU]/mL

## 2019-05-09 ENCOUNTER — Encounter: Payer: Self-pay | Admitting: *Deleted

## 2019-05-09 ENCOUNTER — Other Ambulatory Visit: Payer: Self-pay

## 2019-05-09 ENCOUNTER — Encounter: Payer: Self-pay | Admitting: Radiation Oncology

## 2019-05-09 ENCOUNTER — Ambulatory Visit
Admission: RE | Admit: 2019-05-09 | Discharge: 2019-05-09 | Disposition: A | Discharge status: Home / Self Care | Payer: 59 | Source: Ambulatory Visit | Attending: Radiation Oncology | Admitting: Radiation Oncology

## 2019-05-09 ENCOUNTER — Telehealth: Payer: Self-pay | Admitting: Hematology and Oncology

## 2019-05-09 DIAGNOSIS — C50412 Malignant neoplasm of upper-outer quadrant of left female breast: Secondary | ICD-10-CM | POA: Diagnosis not present

## 2019-05-12 LAB — ESTRADIOL, ULTRA SENS: Estradiol, Sensitive: 4.5 pg/mL

## 2019-05-13 ENCOUNTER — Other Ambulatory Visit: Payer: Self-pay

## 2019-05-13 ENCOUNTER — Telehealth: Payer: Self-pay

## 2019-05-13 ENCOUNTER — Other Ambulatory Visit: Payer: Self-pay | Admitting: Hematology and Oncology

## 2019-05-13 DIAGNOSIS — Z170 Estrogen receptor positive status [ER+]: Secondary | ICD-10-CM

## 2019-05-13 DIAGNOSIS — C50412 Malignant neoplasm of upper-outer quadrant of left female breast: Secondary | ICD-10-CM

## 2019-05-13 NOTE — Telephone Encounter (Signed)
RN placed call regarding adding labs per MD request.   No answer, voicemail box is full.  Will attempt again at later time.

## 2019-05-17 ENCOUNTER — Other Ambulatory Visit: Payer: Self-pay

## 2019-05-17 ENCOUNTER — Inpatient Hospital Stay: Payer: 59

## 2019-05-17 DIAGNOSIS — C50412 Malignant neoplasm of upper-outer quadrant of left female breast: Secondary | ICD-10-CM | POA: Diagnosis not present

## 2019-05-17 LAB — CMP (CANCER CENTER ONLY)
ALT: 12 U/L (ref 0–44)
AST: 14 U/L — ABNORMAL LOW (ref 15–41)
Albumin: 4 g/dL (ref 3.5–5.0)
Alkaline Phosphatase: 48 U/L (ref 38–126)
Anion gap: 9 (ref 5–15)
BUN: 9 mg/dL (ref 6–20)
CO2: 26 mmol/L (ref 22–32)
Calcium: 9.1 mg/dL (ref 8.9–10.3)
Chloride: 105 mmol/L (ref 98–111)
Creatinine: 0.81 mg/dL (ref 0.44–1.00)
GFR, Est AFR Am: >60 mL/min (ref >60)
GFR, Estimated: >60 mL/min (ref >60)
Glucose, Bld: 86 mg/dL (ref 70–99)
Potassium: 4.2 mmol/L (ref 3.5–5.1)
Sodium: 140 mmol/L (ref 135–145)
Total Bilirubin: 0.3 mg/dL (ref 0.3–1.2)
Total Protein: 7.2 g/dL (ref 6.5–8.1)

## 2019-05-17 LAB — CBC WITH DIFFERENTIAL (CANCER CENTER ONLY)
Abs Immature Granulocytes: 0.01 10*3/uL (ref 0.00–0.07)
Basophils Absolute: 0.1 10*3/uL (ref 0.0–0.1)
Basophils Relative: 1 %
Eosinophils Absolute: 0.2 10*3/uL (ref 0.0–0.5)
Eosinophils Relative: 4 %
HCT: 42.9 % (ref 36.0–46.0)
Hemoglobin: 14.1 g/dL (ref 12.0–15.0)
Immature Granulocytes: 0 %
Lymphocytes Relative: 19 %
Lymphs Abs: 0.9 10*3/uL (ref 0.7–4.0)
MCH: 30.3 pg (ref 26.0–34.0)
MCHC: 32.9 g/dL (ref 30.0–36.0)
MCV: 92.3 fL (ref 80.0–100.0)
Monocytes Absolute: 0.4 10*3/uL (ref 0.1–1.0)
Monocytes Relative: 7 %
Neutro Abs: 3.5 10*3/uL (ref 1.7–7.7)
Neutrophils Relative %: 69 %
Platelet Count: 288 10*3/uL (ref 150–400)
RBC: 4.65 MIL/uL (ref 3.87–5.11)
RDW: 13.1 % (ref 11.5–15.5)
WBC Count: 5.1 10*3/uL (ref 4.0–10.5)
nRBC: 0 % (ref 0.0–0.2)

## 2019-05-18 ENCOUNTER — Ambulatory Visit: Payer: 59 | Attending: Internal Medicine

## 2019-05-18 DIAGNOSIS — Z23 Encounter for immunization: Secondary | ICD-10-CM

## 2019-05-18 NOTE — Progress Notes (Signed)
   Covid-19 Vaccination Clinic  Name:  CHANTRELL MOTAMEDI    MRN: [id number] DOB: 28-Dec-1970  05/18/2019  Ms. Yow was observed post Covid-19 immunization for 15 minutes without incident. She was provided with Vaccine Information Sheet and instruction to access the V-Safe system.   Ms. Doree Fudge was instructed to call 911 with any severe reactions post vaccine: Marland Kitchen Difficulty breathing  . Swelling of face and throat  . A fast heartbeat  . A bad rash all over body  . Dizziness and weakness   Immunizations Administered    Name Date Dose VIS Date Route   Pfizer COVID-19 Vaccine 05/18/2019  4:44 PM 0.3 mL 03/16/2018 Intramuscular   Manufacturer: [place]   Lot: [id number]   [place]: [id number]

## 2019-05-21 LAB — ESTRADIOL, ULTRA SENS: Estradiol, Sensitive: 8.3 pg/mL

## 2019-06-02 NOTE — Progress Notes (Signed)
  Radiation Oncology         [phone] ________________________________  Name: Courtney Williams MRN: [id number]  Date: 05/09/2019  DOB: 1970-12-31  End of Treatment Note  Diagnosis:   left-sided breast cancer     Indication for treatment:  Curative       Radiation treatment dates:   03/24/2019 through 05/09/2019  Site/dose:   The patient initially received a dose of 50.4 Gy in 28 fractions to the breast using whole-breast tangent fields. This was delivered using a 3-D conformal technique. The patient then received a boost to the seroma. This delivered an additional 10 Gy in 5 fractions using a 3 field photon boost technique. The total dose was 60.4 Gy.  Narrative: The patient tolerated radiation treatment relatively well.   The patient had some expected skin irritation as she progressed during treatment. Moist desquamation was not present at the end of treatment.  Plan: The patient has completed radiation treatment. The patient will return to radiation oncology clinic for routine followup in one month. I advised the patient to call or return sooner if they have any questions or concerns related to their recovery or treatment. ________________________________  Jodelle Gross, M.D., Ph.D.

## 2019-06-07 ENCOUNTER — Telehealth: Payer: Self-pay | Admitting: Radiation Oncology

## 2019-06-07 NOTE — Telephone Encounter (Signed)
  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  Interval Since Last Radiation:  4 weeks   03/24/2019 through 05/09/2019: The patient initially received a dose of 50.4 Gy in 28 fractions to the breast using whole-breast tangent fields. This was delivered using a 3-D conformal technique. The patient then received a boost to the seroma. This delivered an additional 10 Gy in 5 fractions using a 3 field photon boost technique. The total dose was 60.4 Gy.  Narrative:  The patient was contacted today for routine follow-up. During treatment she did very well with radiotherapy and did not have significant desquamation. She reports she is doing pretty well. Her skin is improving and though still a bit discolored, she is back to her usual skin regimen.   Impression/Plan: 1. Stage IA, pT1cN96miM0 grade 2 ER/PR positive invasive ductal carcinoma of the left breast. The patient has been doing well since completion of radiotherapy. We discussed that we would be happy to continue to follow her as needed, but she will also continue to follow up with Dr. Lindi Adie in medical oncology. She was counseled on skin care as well as measures to avoid sun exposure to this area.  2. Survivorship. We discussed the importance of survivorship evaluation and encouraged her to attend her upcoming visit with that clinic.    Carola Rhine, PAC

## 2019-06-23 NOTE — Telephone Encounter (Signed)
Please disregard

## 2019-07-01 NOTE — Progress Notes (Signed)
49 y.o. G1P1001 Legally Separated White or Caucasian female here for annual exam.  Diagnosed with Stage IIA breast cancer 1.4cm.  She did have a positive lymph node.  Dr. [name] is her surgeon.  Right now, the decision about adjuvant therapy is under discussion.  Has not had a cycle since December.   Chemotherapy finished in February.  Radiation completed in April.    She had genetic testing and this was negative.    [place] was 83 05/05/2019.  Was advised chemotherapy might cause menopause.  Patient's last menstrual period was 01/15/2019.          Sexually active: No.  The current method of family planning is none.   Female partner. Exercising: No.  The patient does not participate in regular exercise at present. Smoker:  Yes   Health Maintenance: Pap:  06-13-16 neg HPV HR , 12-03-2018 neg History of abnormal Pap:  no MMG:  Breast cancer, see reports Colonoscopy:  Guidelines reviewed.   BMD:   none TDaP:  Pt declines.  She is aware this is due.   Pneumonia vaccine(s):  no Shingrix:   no Hep C testing: 12/03/18 Negative Screening Labs: discuss today   reports that she has been smoking cigarettes. She has never used smokeless tobacco. She reports current alcohol use of about 20.0 standard drinks of alcohol per week. She reports current drug use. Drug: Marijuana.  Past Medical History:  Diagnosis Date  . Anxiety   . Breast cancer ([place])    Left Breast  . Family history of CLL (chronic lymphoid leukemia)   . Hypoglycemia   . Hypothyroidism   . Migraines   . Thyroid disease    hypothyroid    Past Surgical History:  Procedure Laterality Date  . BREAST LUMPECTOMY WITH RADIOACTIVE SEED AND SENTINEL LYMPH NODE BIOPSY Left 11/10/2018   Procedure: LEFT BREAST LUMPECTOMY WITH RADIOACTIVE SEED AND LEFT AXILLARY SENTINEL LYMPH NODE BIOPSY;  Surgeon: Rolm Bookbinder, MD;  Location: [facility];  Service: General;  Laterality: Left;  . NO PAST SURGERIES    . PORTACATH PLACEMENT Right 12/27/2018    Procedure: INSERTION PORT-A-CATH WITH ULTRASOUND;  Surgeon: Rolm Bookbinder, MD;  Location: [facility];  Service: General;  Laterality: Right;    Current Outpatient Medications  Medication Sig Dispense Refill  . ALPRAZolam (XANAX) 0.25 MG tablet Take 1 tablet (0.25 mg total) by mouth 2 (two) times daily as needed for anxiety. 30 tablet 1  . fexofenadine (ALLEGRA ALLERGY) 60 MG tablet Take 1 tablet (60 mg total) by mouth 2 (two) times daily. 120 tablet 0  . levothyroxine (SYNTHROID) 125 MCG tablet TAKE 1 TABLET (125 MCG TOTAL) BY MOUTH DAILY BEFORE BREAKFAST. 30 tablet 12  . loratadine (CLARITIN) 10 MG tablet Take 1 tablet (10 mg total) by mouth daily. 30 tablet 0   No current facility-administered medications for this visit.    Family History  Problem Relation Age of Onset  . Thyroid disease Father   . Leukemia Father 62       CLL  . Heart attack Maternal Aunt   . Heart disease Maternal Grandmother   . Breast cancer Neg Hx     Review of Systems  All other systems reviewed and are negative.   Exam:   BP 110/62 (BP Location: Right Arm, Patient Position: Sitting, Cuff Size: Normal)   Pulse 76   Temp (!) 97.2 F (36.2 C) (Skin)   Ht 5' 1.75" (1.568 m)   Wt 171 lb (77.6 kg)  LMP 01/15/2019   BMI 31.53 kg/m   Height: 5' 1.75" (156.8 cm)  General appearance: alert, cooperative and appears stated age Head: Normocephalic, without obvious abnormality, atraumatic Neck: no adenopathy, supple, symmetrical, trachea midline and thyroid normal to inspection and palpation Lungs: clear to auscultation bilaterally Breasts: normal appearance, no masses or tenderness, well healed left upper outer quadrant scar, minimal radiation changes Heart: regular rate and rhythm Abdomen: soft, non-tender; bowel sounds normal; no masses,  no organomegaly Extremities: extremities normal, atraumatic, no cyanosis or edema Skin: Skin color, texture, turgor normal. No rashes or  lesions Lymph nodes: Cervical, supraclavicular, and axillary nodes normal. No abnormal inguinal nodes palpated Neurologic: Grossly normal   Pelvic: External genitalia:  no lesions              Urethra:  normal appearing urethra with no masses, tenderness or lesions              Bartholins and Skenes: normal                 Vagina: normal appearing vagina with normal color and discharge, no lesions              Cervix: no lesions              Pap taken: Yes.   Bimanual Exam:  Uterus:  normal size, contour, position, consistency, mobility, non-tender              Adnexa: normal adnexa and no mass, fullness, tenderness               Rectovaginal: Declined               Anus: no visible lesions  Chaperone, Terence Lux, CMA, was present for exam.  A:  Well Woman with normal exam H/O Stage IIA breast cancer, s/p lumpectomy, chemo and radiation Hypothyroidism  P:   Mammogram will be completed per oncology recommendation but likely due this fall pap smear neg 11/2018 but HR HPV needed so obtained today Colonoscopy discussed today.  She declines this for now as just completed treated for breast cancer but will consider in the future No blood work obtained today Tdap due.  Pt aware and declines today.  Understands to have this done with any exposure. Pt also advised to call with any vaginal bleeding AEX 1 year or follow up prn new issues, concerns

## 2019-07-04 ENCOUNTER — Other Ambulatory Visit: Payer: Self-pay

## 2019-07-04 ENCOUNTER — Ambulatory Visit: Payer: 59 | Admitting: Obstetrics & Gynecology

## 2019-07-04 ENCOUNTER — Encounter: Payer: Self-pay | Admitting: Obstetrics & Gynecology

## 2019-07-04 ENCOUNTER — Ambulatory Visit: Payer: 59 | Admitting: Certified Nurse Midwife

## 2019-07-04 VITALS — BP 110/62 | HR 76 | Temp 97.2°F | Ht 61.75 in | Wt 171.0 lb

## 2019-07-04 DIAGNOSIS — Z124 Encounter for screening for malignant neoplasm of cervix: Secondary | ICD-10-CM

## 2019-07-04 DIAGNOSIS — E039 Hypothyroidism, unspecified: Secondary | ICD-10-CM | POA: Diagnosis not present

## 2019-07-04 DIAGNOSIS — Z01411 Encounter for gynecological examination (general) (routine) with abnormal findings: Secondary | ICD-10-CM | POA: Diagnosis not present

## 2019-07-04 DIAGNOSIS — Z01419 Encounter for gynecological examination (general) (routine) without abnormal findings: Secondary | ICD-10-CM

## 2019-07-04 DIAGNOSIS — L509 Urticaria, unspecified: Secondary | ICD-10-CM | POA: Diagnosis not present

## 2019-07-04 MED ORDER — LEVOTHYROXINE SODIUM 125 MCG PO TABS: ORAL_TABLET | ORAL | 0 refills | Status: DC

## 2019-07-05 ENCOUNTER — Other Ambulatory Visit: Payer: Self-pay

## 2019-07-05 ENCOUNTER — Other Ambulatory Visit (HOSPITAL_COMMUNITY)
Admission: RE | Admit: 2019-07-05 | Discharge: 2019-07-05 | Disposition: A | Discharge status: Home / Self Care | Payer: 59 | Source: Ambulatory Visit | Attending: Obstetrics & Gynecology | Admitting: Obstetrics & Gynecology

## 2019-07-05 DIAGNOSIS — Z124 Encounter for screening for malignant neoplasm of cervix: Secondary | ICD-10-CM | POA: Diagnosis present

## 2019-07-07 ENCOUNTER — Encounter: Payer: Self-pay | Admitting: Obstetrics & Gynecology

## 2019-07-08 LAB — CERVICOVAGINAL ANCILLARY ONLY
Comment: NEGATIVE
High risk HPV: NEGATIVE

## 2019-08-04 ENCOUNTER — Telehealth: Payer: Self-pay | Admitting: Hematology and Oncology

## 2019-08-04 NOTE — Telephone Encounter (Signed)
Per provider rescheduled 7/16 appt, called and confirmed appt with pt

## 2019-08-05 ENCOUNTER — Inpatient Hospital Stay: Payer: 59 | Attending: Hematology and Oncology | Admitting: Hematology and Oncology

## 2019-08-05 ENCOUNTER — Ambulatory Visit: Payer: 59 | Admitting: Hematology and Oncology

## 2019-08-05 ENCOUNTER — Other Ambulatory Visit: Payer: Self-pay

## 2019-08-05 DIAGNOSIS — Z79899 Other long term (current) drug therapy: Secondary | ICD-10-CM | POA: Insufficient documentation

## 2019-08-05 DIAGNOSIS — Z170 Estrogen receptor positive status [ER+]: Secondary | ICD-10-CM | POA: Diagnosis not present

## 2019-08-05 DIAGNOSIS — R634 Abnormal weight loss: Secondary | ICD-10-CM | POA: Diagnosis not present

## 2019-08-05 DIAGNOSIS — C50412 Malignant neoplasm of upper-outer quadrant of left female breast: Secondary | ICD-10-CM | POA: Diagnosis present

## 2019-08-05 NOTE — Progress Notes (Signed)
Patient Care Team: Patient, [name] as PCP - General (General Practice) Rockwell Germany, RN as Oncology Nurse Navigator Mauro Kaufmann, RN as Oncology Nurse Navigator Rolm Bookbinder, MD as Consulting Physician (General Surgery) Nicholas Lose, MD as Consulting Physician (Hematology and Oncology)  DIAGNOSIS:    ICD-10-CM   1. Malignant neoplasm of upper-outer quadrant of left breast in female, estrogen receptor positive ([place])  C50.412    Z17.0     SUMMARY OF ONCOLOGIC HISTORY: Oncology History  Malignant neoplasm of upper-outer quadrant of left breast in female, estrogen receptor positive ([place])  10/22/2018 Initial Diagnosis   Routine screening mammogram showed a 1.4cm mass in the left breast at the 2:00 position with no left axillary adenopathy. Biopsy showed IDC, grade 2, HER-2 equivocal (2+), ER+ 80%, PR+ 10%, Ki67 15%.   10/27/2018 Cancer Staging   Staging form: Breast, AJCC 8th Edition - Clinical stage from 10/27/2018: Stage IA (cT1c, cN0, cM0, G2, ER+, PR+, HER2-) - Signed by Nicholas Lose, MD on 10/27/2018   11/05/2018 Genetic Testing   Negative genetic testing: no pathogenic variants detected on the [facility] Breast Cancer STAT panel and Common Hereditary Cancers Panel. The report date is 11/05/2018.  The Breast Cancer STAT panel offered by [facility] includes sequencing and rearrangement analysis for the following 9 genes:  ATM, BRCA1, BRCA2, CDH1, CHEK2, PALB2, PTEN, STK11 and TP53.  The Common Hereditary Gene Panel offered by [facility] includes sequencing and/or deletion duplication testing of the following 48 genes: APC, ATM, AXIN2, BARD1, BMPR1A, BRCA1, BRCA2, BRIP1, CDH1, CDK4, CDKN2A (p14ARF), CDKN2A (p16INK4a), CHEK2, CTNNA1, DICER1, EPCAM (Deletion/duplication testing only), GREM1 (promoter region deletion/duplication testing only), KIT, MEN1, MLH1, MSH2, MSH3, MSH6, MUTYH, NBN, NF1, NHTL1, PALB2, PDGFRA, PMS2, POLD1, POLE, PTEN, RAD50, RAD51C, RAD51D, RNF43, SDHB, SDHC,  SDHD, SMAD4, SMARCA4. STK11, TP53, TSC1, TSC2, and VHL.  The following genes were evaluated for sequence changes only: SDHA and HOXB13 c.251G>A variant only.   11/10/2018 Surgery   Left lumpectomy [name]): IDC, grade 2, 1.4cm, clear margins, and micrometastasis in 1 left axillary lymph node.   11/30/2018 Oncotype testing   Score of 22, 18% chance of distant recurrence in 9 years without systemic treatment.   12/28/2018 -  Chemotherapy   The patient had palonosetron (ALOXI) injection 0.25 mg, 0.25 mg, Intravenous,  Once, 4 of 4 cycles Administration: 0.25 mg (12/28/2018), 0.25 mg (01/18/2019), 0.25 mg (02/08/2019), 0.25 mg (03/01/2019) pegfilgrastim (NEULASTA ONPRO KIT) injection 6 mg, 6 mg, Subcutaneous, Once, 4 of 4 cycles Administration: 6 mg (12/28/2018), 6 mg (01/18/2019), 6 mg (02/08/2019), 6 mg (03/01/2019) cyclophosphamide (CYTOXAN) 1,100 mg in sodium chloride 0.9 % 250 mL chemo infusion, 600 mg/m2 = 1,100 mg, Intravenous,  Once, 4 of 4 cycles Administration: 1,100 mg (12/28/2018), 1,100 mg (01/18/2019), 1,100 mg (02/08/2019), 1,100 mg (03/01/2019) DOCEtaxel (TAXOTERE) 140 mg in sodium chloride 0.9 % 250 mL chemo infusion, 75 mg/m2 = 140 mg, Intravenous,  Once, 4 of 4 cycles Administration: 140 mg (12/28/2018), 140 mg (01/18/2019), 140 mg (02/08/2019), 140 mg (03/01/2019)  for chemotherapy treatment.    03/25/2019 -  Radiation Therapy   Adjuvant radiation     CHIEF COMPLIANT: Follow-up of left breast cancer to discuss antiestrogen therapy  INTERVAL HISTORY: Courtney Williams is a 49 y.o. with above-mentioned history of left breast cancer who underwent a lumpectomy, adjuvant chemotherapy, and radiation. She presents to the clinic todayto discuss antiestrogen therapy.   ALLERGIES:  has No Known Allergies.  MEDICATIONS:  Current Outpatient Medications  Medication Sig  Dispense Refill  . ALPRAZolam (XANAX) 0.25 MG tablet Take 1 tablet (0.25 mg total) by mouth 2 (two) times daily as needed for anxiety.  30 tablet 1  . fexofenadine (ALLEGRA ALLERGY) 60 MG tablet Take 1 tablet (60 mg total) by mouth 2 (two) times daily. 120 tablet 0  . levothyroxine (SYNTHROID) 125 MCG tablet TAKE 1 TABLET (125 MCG TOTAL) BY MOUTH DAILY BEFORE BREAKFAST. 90 tablet 0  . loratadine (CLARITIN) 10 MG tablet Take 1 tablet (10 mg total) by mouth daily. 30 tablet 0   No current facility-administered medications for this visit.    PHYSICAL EXAMINATION: ECOG PERFORMANCE STATUS: 1 - Symptomatic but completely ambulatory  There were no vitals filed for this visit. There were no vitals filed for this visit.  BREAST: No palpable masses or nodules in either right or left breasts. No palpable axillary supraclavicular or infraclavicular adenopathy no breast tenderness or nipple discharge. (exam performed in the presence of a chaperone)  LABORATORY DATA:  I have reviewed the data as listed CMP Latest Ref Rng & Units 05/17/2019 05/05/2019 03/01/2019  Glucose 70 - 99 mg/dL 86 90 93  BUN 6 - 20 mg/dL _0 Creatinine 0.44 - 1.00 mg/dL 0.81 0.74 0.69  Sodium 135 - 145 mmol/L 140 140 141  Potassium 3.5 - 5.1 mmol/L 4.2 3.9 3.6  Chloride 98 - 111 mmol/L 105 106 109  CO2 22 - 32 mmol/L _1 Calcium 8.9 - 10.3 mg/dL 9.1 9.2 8.5(L)  Total Protein 6.5 - 8.1 g/dL 7.2 7.2 6.4(L)  Total Bilirubin 0.3 - 1.2 mg/dL 0.3 0.3 0.4  Alkaline Phos 38 - 126 U/L 48 45 44  AST 15 - 41 U/L 14(L) 13(L) 11(L)  ALT 0 - 44 U/L _2 Lab Results  Component Value Date   WBC 5.1 05/17/2019   HGB 14.1 05/17/2019   HCT 42.9 05/17/2019   MCV 92.3 05/17/2019   PLT 288 05/17/2019   NEUTROABS 3.5 05/17/2019    ASSESSMENT & PLAN:  Malignant neoplasm of upper-outer quadrant of left breast in female, estrogen receptor positive ([place]) 10/22/18:Routine screening mammogram showed a 1.4cm mass in the left breast at the 2:00 position with no left axillary adenopathy. Biopsy showed IDC, grade 2, HER-2 equivocal (2+), ER+ 80%, PR+ 10%,  Ki6715%.  11/10/2018:Left lumpectomy [name]): IDC, grade 2, 1.4cm, clear margins, and micrometastasis in 1 left axillary lymph node.ER 80%, PR 10%, Ki-67 15%, HER-2 equivocal 2+ by IHC but FISH negative  Oncotype DX score: 22 distant recurrence at 9 years with hormone therapy alone 18%  Recommendation: 1. Adjuvant chemotherapy Taxotere and Cytoxan every 3 weeks x4 cycles completed 03/01/2019 2. Adj XRT completed 05/05/2019 3. Adj Anti-estrogen therapy ----------------------------------------------------------------------------------------------------------------------------------------------------------- Treatment plan: Adjuvant antiestrogen therapy with tamoxifen 20 mg daily x10 years vs anastrozole 1 mg daily X 7 years I recommended tamoxifen therapy and discussed the risks and benefits of tamoxifen in detail. Patient is not sure whether she wants to take it because she is feeling so good and does not want to mess that up. If she decides to take it then she will call us and we will send a prescription.  She may start at half a tablet daily and then increase it to full tablet if she tolerates it.  Weight loss: Patient is extremely concerned about the weight gain that she has had through chemo.  She has not lost any of that.  She has not been exercising at all.  She eats a very healthy diet.  RTC in 3 months for survivorship clinic visit.  No orders of the defined types were placed in this encounter.  The patient has a good understanding of the overall plan. she agrees with it. she will call with any problems that may develop before the next visit here.  Total time spent: 30 mins including face to face time and time spent for planning, charting and coordination of care  Nicholas Lose, MD 08/05/2019  I, Cloyde Reams Dorshimer, am acting as scribe for Dr. Nicholas Lose.  I have reviewed the above documentation for accuracy and completeness, and I agree with the above.

## 2019-08-05 NOTE — Assessment & Plan Note (Signed)
10/22/18:Routine screening mammogram showed a 1.4cm mass in the left breast at the 2:00 position with no left axillary adenopathy. Biopsy showed IDC, grade 2, HER-2 equivocal (2+), ER+ 80%, PR+ 10%, Ki6715%.  11/10/2018:Left lumpectomy Courtney Williams): IDC, grade 2, 1.4cm, clear margins, and micrometastasis in 1 left axillary lymph node.ER 80%, PR 10%, Ki-67 15%, HER-2 equivocal 2+ by IHC but FISH negative  Oncotype DX score: 22 distant recurrence at 9 years with hormone therapy alone 18%  Recommendation: 1. Adjuvant chemotherapy Taxotere and Cytoxan every 3 weeks x4 cycles completed 03/01/2019 2. Adj XRT completed 05/05/2019 3. Adj Anti-estrogen therapy ----------------------------------------------------------------------------------------------------------------------------------------------------------- Treatment plan: Adjuvant antiestrogen therapy with tamoxifen 20 mg daily x10 years vs anastrozole 1 mg daily X 7 years Patient did not get tested for menopausal status and therefore we did not start the prescription for antiestrogen therapy.  Financial issues:

## 2019-08-08 ENCOUNTER — Telehealth: Payer: Self-pay | Admitting: Hematology and Oncology

## 2019-08-08 NOTE — Telephone Encounter (Signed)
No 7/16 los, no changes made to pt schedule

## 2019-08-10 ENCOUNTER — Telehealth: Payer: Self-pay | Admitting: Adult Health

## 2019-08-10 NOTE — Telephone Encounter (Signed)
Rescheduled appointment per 7/21 message. Patient is aware of updated appointment date and time.

## 2019-09-19 ENCOUNTER — Other Ambulatory Visit: Payer: Self-pay | Admitting: Hematology and Oncology

## 2019-09-19 DIAGNOSIS — L509 Urticaria, unspecified: Secondary | ICD-10-CM

## 2019-09-25 ENCOUNTER — Other Ambulatory Visit: Payer: Self-pay | Admitting: Obstetrics & Gynecology

## 2019-09-25 DIAGNOSIS — E039 Hypothyroidism, unspecified: Secondary | ICD-10-CM

## 2019-09-27 NOTE — Telephone Encounter (Signed)
Medication refill request: Levothyroxine 139mcg Last AEX:  07/04/19 Next AEX: 08/28/20 Last MMG (if hormonal medication request): NA Refill authorized: 90/0

## 2019-10-12 ENCOUNTER — Other Ambulatory Visit: Payer: Self-pay

## 2019-10-12 ENCOUNTER — Ambulatory Visit
Admission: RE | Admit: 2019-10-12 | Discharge: 2019-10-12 | Disposition: A | Discharge status: Home / Self Care | Payer: 59 | Source: Ambulatory Visit | Attending: Hematology and Oncology | Admitting: Hematology and Oncology

## 2019-10-12 DIAGNOSIS — Z170 Estrogen receptor positive status [ER+]: Secondary | ICD-10-CM

## 2019-10-20 ENCOUNTER — Telehealth: Payer: Self-pay | Admitting: Adult Health

## 2019-10-20 NOTE — Telephone Encounter (Signed)
Rescheduled appts per LC template change. Pt's voicemail box was full. Mailed appt reminder and calendar.

## 2019-11-04 ENCOUNTER — Encounter: Payer: 59 | Admitting: Adult Health

## 2019-11-10 ENCOUNTER — Encounter: Payer: 59 | Admitting: Adult Health

## 2019-12-09 ENCOUNTER — Other Ambulatory Visit: Payer: Self-pay

## 2019-12-09 ENCOUNTER — Inpatient Hospital Stay: Payer: BC Managed Care – PPO | Admitting: Adult Health

## 2019-12-20 ENCOUNTER — Inpatient Hospital Stay: Payer: BC Managed Care – PPO | Attending: Adult Health | Admitting: Adult Health

## 2020-06-21 IMAGING — MG MM PLC BREAST LOC DEV 1ST LESION INC MAMMO GUIDE*L*
6 series · 6 of 6 positions shown · non-contrast
Comparison: Previous exam(s).

CLINICAL DATA: The patient presents for seed localization of the
LEFT breast prior to lumpectomy for SILWE diagnosed grade 2 invasive
ductal carcinoma.

EXAM:
MAMMOGRAPHIC GUIDED RADIOACTIVE SEED LOCALIZATION OF THE LEFT BREAST

[L LM (1 of 4)]
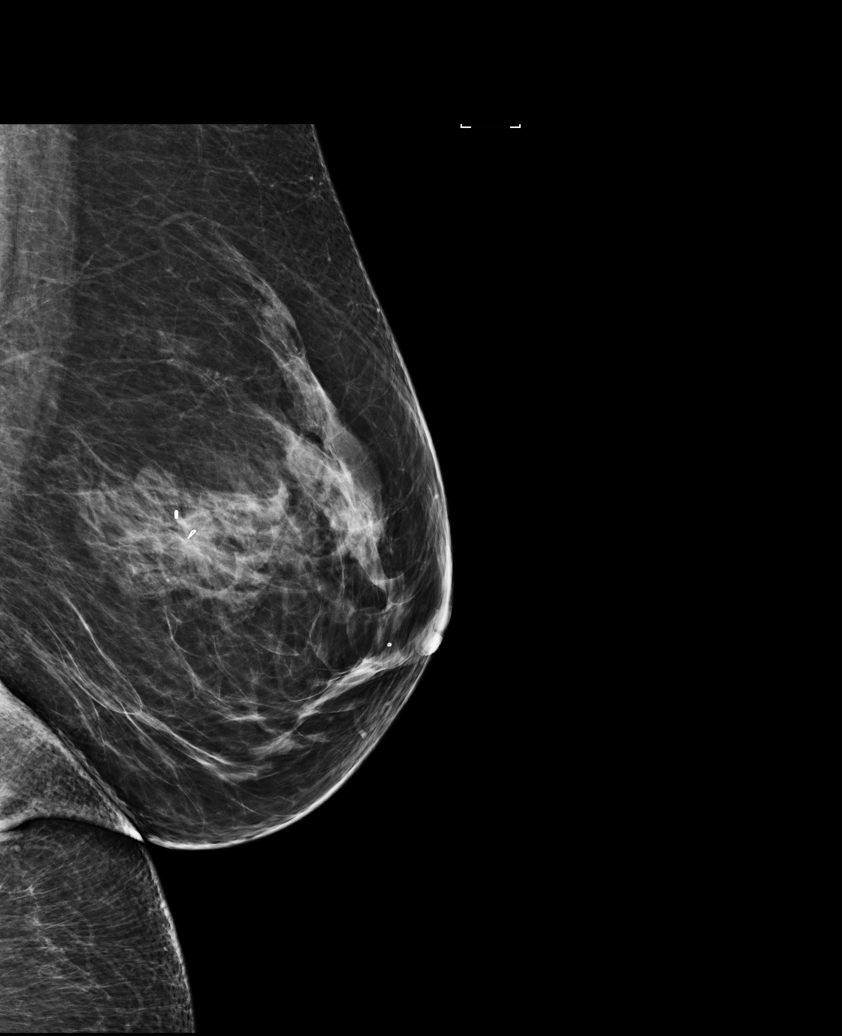

[L CC (1 of 2)]
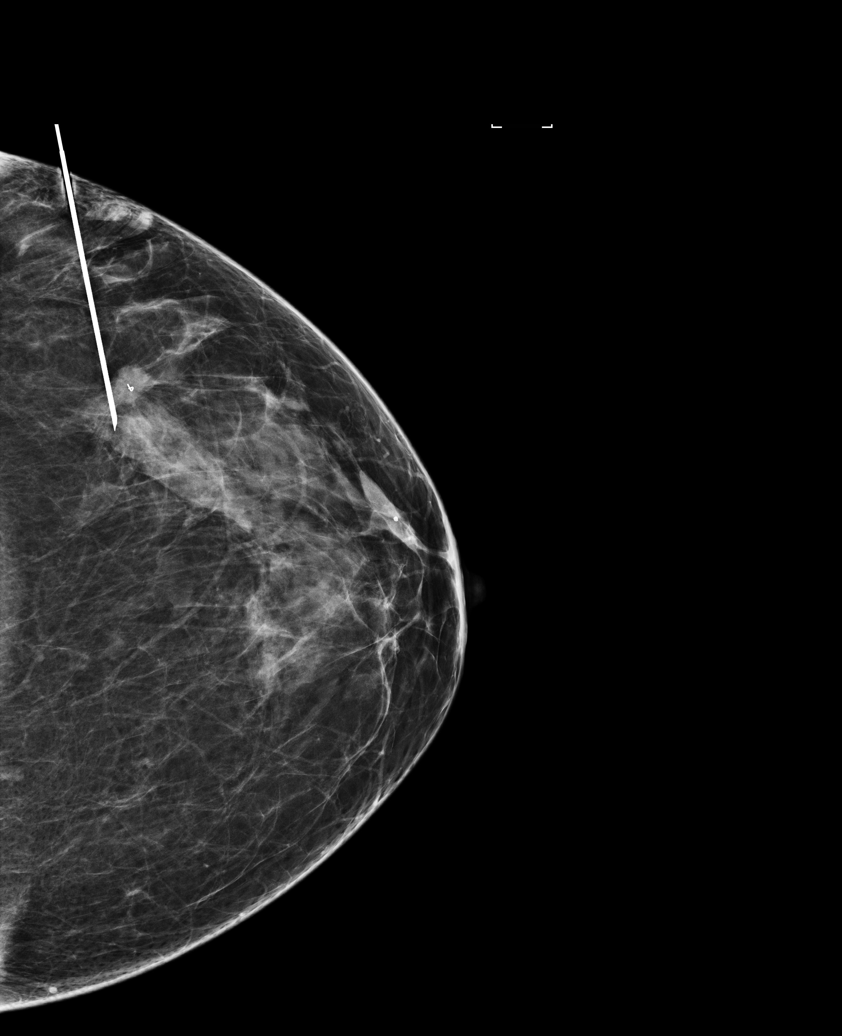

[L LM (2 of 4)]
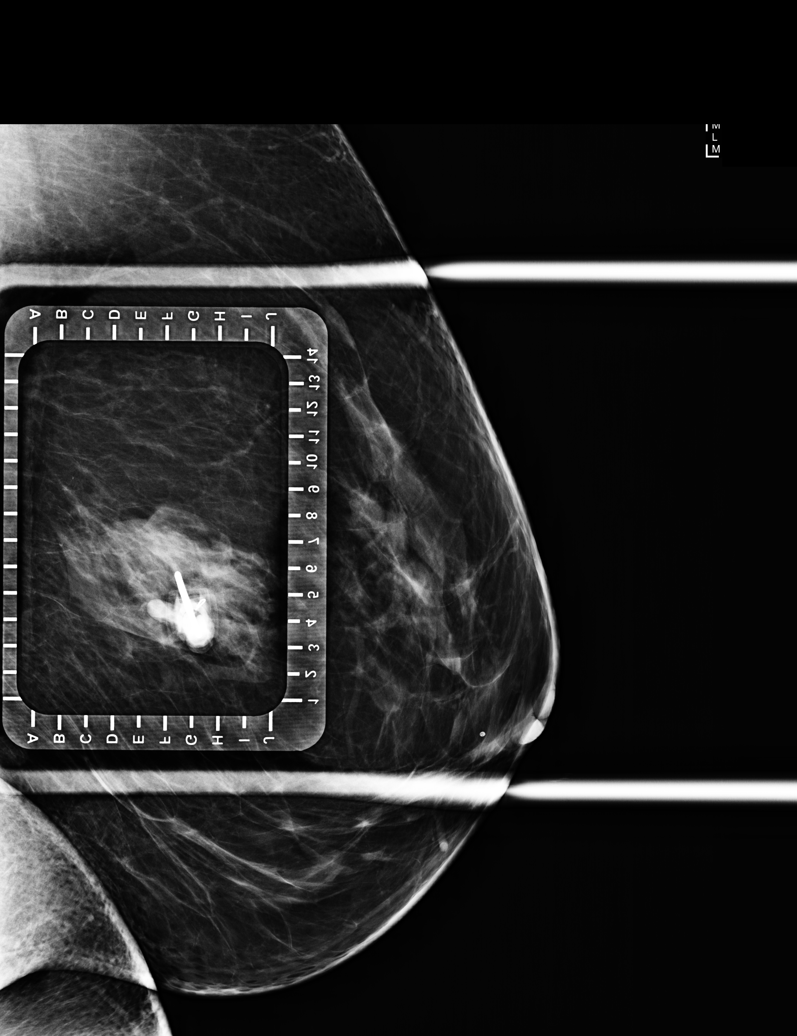

[L LM (3 of 4)]
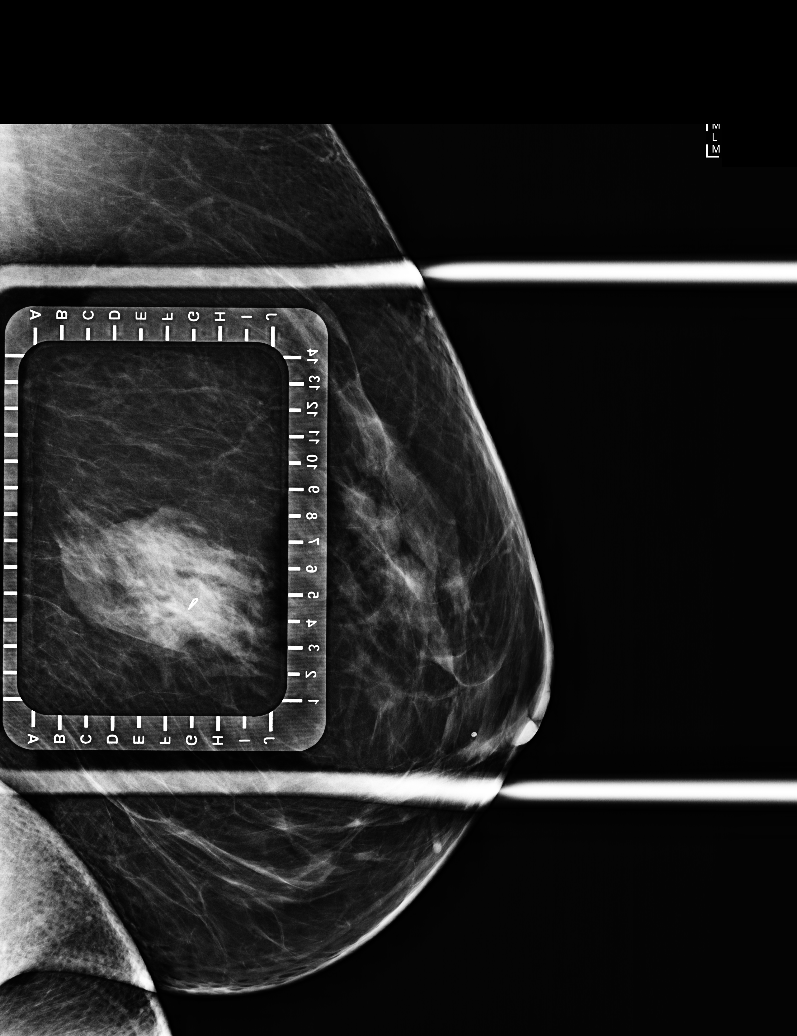

[L LM (4 of 4)]
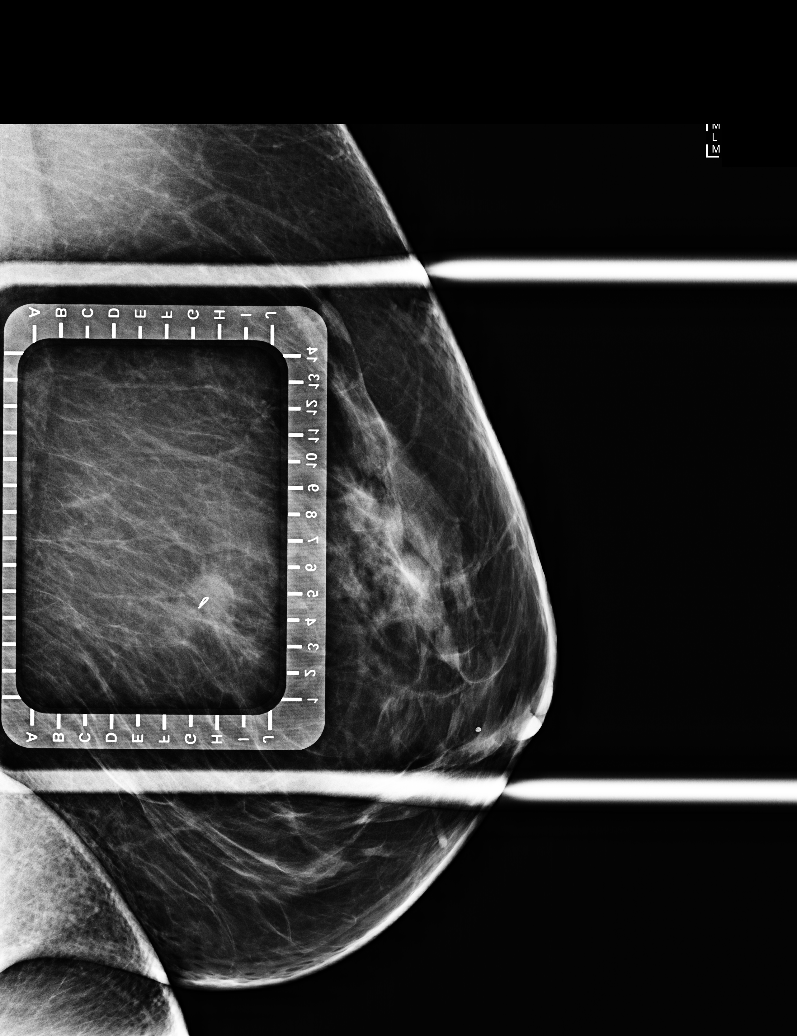

[L CC (2 of 2)]
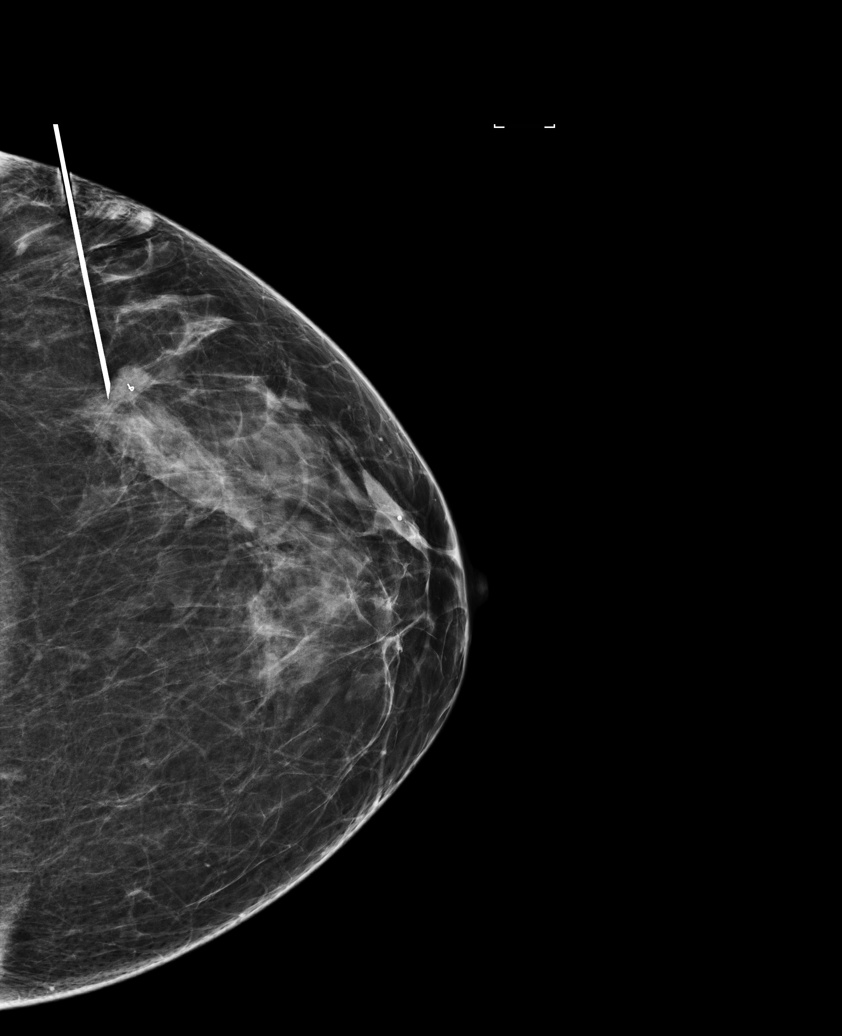

[6 of 6 positions shown; findings below may reference images not displayed]

FINDINGS: Patient presents for radioactive seed localization prior to
lumpectomy. I met with the patient and we discussed the procedure of
seed localization including benefits and alternatives. We discussed
the high likelihood of a successful procedure. We discussed the
risks of the procedure including infection, bleeding, tissue injury
and further surgery. We discussed the low dose of radioactivity
involved in the procedure. Informed, written consent was given.

The usual time-out protocol was performed immediately prior to the
procedure.

Using mammographic guidance, sterile technique, 1% lidocaine and an
C-2AF radioactive seed, the ribbon shaped clip in the LATERAL
portion of the LEFT breast was localized using a LATERAL approach.
The follow-up mammogram images confirm the seed in the expected
location and were marked for Dr. Danii.

Follow-up survey of the patient confirms presence of the radioactive
seed.

Order number of C-2AF seed:  [id number].

Total activity:  0.255 millicuries reference Date: 11/08/2018

The patient tolerated the procedure well and was released from the
[REDACTED]. She was given instructions regarding seed removal.
IMPRESSION: Radioactive seed localization left breast. No apparent
complications.

## 2020-07-10 NOTE — Progress Notes (Signed)
50 y.o. G53P1001 Divorced White or Caucasian Not Hispanic or Latino female here for annual exam.  Patient states that she has been more tired lately. She is out of xanax.   10/20 H/O left breast cancer, ER +, negative genetic testing S/P left lumpectomy, + node (they only took one node) S/p chemotherapy, radiation.  Counseled about tamoxifen in 7/21, didn't start it.   Stopped cycling with chemotherapy. She has tolerable vasomotor symptoms. Sexually active, female partner, same partner x 6 months. No dyspareunia. No dryness. Declines STD testing.   Hypothyroid, some increase in fatigue in the last few months. Requests thyroid panel instead of TSH.   PMP    Sexually active: Yes.    The current method of family planning is none.   Same sex partner  Exercising: No.   None  Smoker:  yes, ~3 cigarettes a day. She has been smoking on and off for years. Didn't smoke while on chemotherapy. She plans to quit.   Health Maintenance: Pap: 07/15/19  Hr HPV Only which was Neg; 12/03/18 Negative, no hpv testing; 06-13-16 neg HPV HR neg History of abnormal Pap:  no MMG:  10/12/19 birads 2 benign  BMD:   none  Colonoscopy: none  TDaP:  unsure  Gardasil: NA   reports that she has been smoking cigarettes. She has never used smokeless tobacco. She reports current alcohol use of about 20.0 standard drinks of alcohol per week. She reports current drug use. Drug: Marijuana. Drinks 5-6 drinks on Friday and Saturday nights, may have a one glass of wine during the week. 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 Local.   Past Medical History:  Diagnosis Date   Anxiety    Breast cancer ([place])    Left Breast   Family history of CLL (chronic lymphoid leukemia)    Hypoglycemia    Hypothyroidism    Migraines    Personal history of chemotherapy 03/01/2019   Personal history of radiation therapy 04/04/2019    Past Surgical History:  Procedure Laterality Date   BREAST BIOPSY     BREAST LUMPECTOMY Left  11/2018   invasive ductal carcinoma   BREAST LUMPECTOMY WITH RADIOACTIVE SEED AND SENTINEL LYMPH NODE BIOPSY Left 11/10/2018   Procedure: LEFT BREAST LUMPECTOMY WITH RADIOACTIVE SEED AND LEFT AXILLARY SENTINEL LYMPH NODE BIOPSY;  Surgeon: Rolm Bookbinder, MD;  Location: [facility];  Service: General;  Laterality: Left;   PORTACATH PLACEMENT Right 12/27/2018   Procedure: INSERTION PORT-A-CATH WITH ULTRASOUND;  Surgeon: Rolm Bookbinder, MD;  Location: [facility];  Service: General;  Laterality: Right;    Current Outpatient Medications  Medication Sig Dispense Refill   ALPRAZolam (XANAX) 0.25 MG tablet TAKE 1 TABLET (0.25 MG TOTAL) BY MOUTH 2 (TWO) TIMES DAILY AS NEEDED FOR ANXIETY. 30 tablet 1   fexofenadine (ALLEGRA ALLERGY) 60 MG tablet Take 1 tablet (60 mg total) by mouth 2 (two) times daily. 120 tablet 0   levothyroxine (SYNTHROID) 125 MCG tablet TAKE 1 TABLET (125 MCG TOTAL) BY MOUTH DAILY BEFORE BREAKFAST. 90 tablet 2   loratadine (CLARITIN) 10 MG tablet Take 1 tablet (10 mg total) by mouth daily. 30 tablet 0   No current facility-administered medications for this visit.    Family History  Problem Relation Age of Onset   Thyroid disease Father    Leukemia Father 29       CLL   Heart attack Maternal Aunt    Heart disease Maternal Grandmother    Breast cancer  Neg Hx     Review of Systems  Constitutional:  Positive for fatigue.  Psychiatric/Behavioral:  The patient is nervous/anxious.   All other systems reviewed and are negative.  Exam:   BP 130/68   Pulse 68   Ht 5' 1.75" (1.568 m)   Wt 174 lb (78.9 kg)   SpO2 99%   BMI 32.08 kg/m   Weight change: @WEIGHTCHANGE @ Height:   Height: 5' 1.75" (156.8 cm)  Ht Readings from Last 3 Encounters:  07/16/20 5' 1.75" (1.568 m)  08/05/19 5' 1.75" (1.568 m)  07/04/19 5' 1.75" (1.568 m)    General appearance: alert, cooperative and appears stated age Head: Normocephalic, without obvious abnormality, atraumatic Neck:  no adenopathy, supple, symmetrical, trachea midline and thyroid normal to inspection and palpation Lungs: clear to auscultation bilaterally Cardiovascular: regular rate and rhythm Breasts: normal appearance, no masses or tenderness, evidence of left lumpectomy at 1-2 o'clock, inverted left nipple (stable per patient). Abdomen: soft, non-tender; non distended,  no masses,  no organomegaly Extremities: extremities normal, atraumatic, no cyanosis or edema Skin: Skin color, texture, turgor normal. No rashes or lesions Lymph nodes: Cervical, supraclavicular, and axillary nodes normal. No abnormal inguinal nodes palpated Neurologic: Grossly normal   Pelvic: External genitalia:  no lesions              Urethra:  normal appearing urethra with no masses, tenderness or lesions              Bartholins and Skenes: normal                 Vagina: normal appearing vagina with normal color and discharge, no lesions              Cervix: no lesions               Bimanual Exam:  Uterus:  normal size, contour, position, consistency, mobility, non-tender              Adnexa: no mass, fullness, tenderness               Rectovaginal: Confirms               Anus:  normal sphincter tone, no lesions  Courtney Williams chaperoned for the exam.  1. Well woman exam Discussed breast self exam Discussed calcium and vit D intake No pap this year Mammogram due in the fall.  2. Hypothyroidism, unspecified type - Thyroid Panel With TSH  3. Malignant neoplasm of upper-outer quadrant of left breast in female, estrogen receptor positive ([place]) F/U with Oncology  4. Colon cancer screening Discussed options, she will consider and let me know  5. Laboratory exam ordered as part of routine general medical examination - CBC - Comprehensive metabolic panel - Lipid panel  6. Other fatigue - Thyroid Panel With TSH  7. Smoker Plans to quit at some point  8. Vitamin D deficiency Reports min vit d in her diet.

## 2020-07-16 ENCOUNTER — Other Ambulatory Visit: Payer: Self-pay

## 2020-07-16 ENCOUNTER — Encounter: Payer: Self-pay | Admitting: Obstetrics and Gynecology

## 2020-07-16 ENCOUNTER — Ambulatory Visit (INDEPENDENT_AMBULATORY_CARE_PROVIDER_SITE_OTHER): Payer: BC Managed Care – PPO | Admitting: Obstetrics and Gynecology

## 2020-07-16 VITALS — BP 130/68 | HR 68 | Ht 61.75 in | Wt 174.0 lb

## 2020-07-16 DIAGNOSIS — R5383 Other fatigue: Secondary | ICD-10-CM

## 2020-07-16 DIAGNOSIS — Z0000 Encounter for general adult medical examination without abnormal findings: Secondary | ICD-10-CM

## 2020-07-16 DIAGNOSIS — Z01419 Encounter for gynecological examination (general) (routine) without abnormal findings: Secondary | ICD-10-CM

## 2020-07-16 DIAGNOSIS — E039 Hypothyroidism, unspecified: Secondary | ICD-10-CM

## 2020-07-16 DIAGNOSIS — Z1211 Encounter for screening for malignant neoplasm of colon: Secondary | ICD-10-CM

## 2020-07-16 DIAGNOSIS — F17200 Nicotine dependence, unspecified, uncomplicated: Secondary | ICD-10-CM

## 2020-07-16 DIAGNOSIS — E559 Vitamin D deficiency, unspecified: Secondary | ICD-10-CM

## 2020-07-16 DIAGNOSIS — C50412 Malignant neoplasm of upper-outer quadrant of left female breast: Secondary | ICD-10-CM | POA: Diagnosis not present

## 2020-07-16 DIAGNOSIS — Z170 Estrogen receptor positive status [ER+]: Secondary | ICD-10-CM

## 2020-07-16 NOTE — Patient Instructions (Signed)

## 2020-07-17 LAB — COMPREHENSIVE METABOLIC PANEL
AG Ratio: 1.7 (calc) (ref 1.0–2.5)
ALT: 11 U/L (ref 6–29)
AST: 11 U/L (ref 10–35)
Albumin: 4.5 g/dL (ref 3.6–5.1)
Alkaline phosphatase (APISO): 64 U/L (ref 31–125)
BUN: 12 mg/dL (ref 7–25)
CO2: 29 mmol/L (ref 20–32)
Calcium: 10.1 mg/dL (ref 8.6–10.2)
Chloride: 103 mmol/L (ref 98–110)
Creat: 0.77 mg/dL (ref 0.50–1.10)
Globulin: 2.7 g/dL (calc) (ref 1.9–3.7)
Glucose, Bld: 85 mg/dL (ref 65–99)
Potassium: 3.8 mmol/L (ref 3.5–5.3)
Sodium: 139 mmol/L (ref 135–146)
Total Bilirubin: 0.4 mg/dL (ref 0.2–1.2)
Total Protein: 7.2 g/dL (ref 6.1–8.1)

## 2020-07-17 LAB — LIPID PANEL
Cholesterol: 208 mg/dL — ABNORMAL HIGH (ref <200)
HDL: 86 mg/dL (ref >=50)
LDL Cholesterol (Calc): 99 mg/dL (calc)
Non-HDL Cholesterol (Calc): 122 mg/dL (calc) (ref <130)
Total CHOL/HDL Ratio: 2.4 (calc) (ref <5.0)
Triglycerides: 132 mg/dL (ref <150)

## 2020-07-17 LAB — THYROID PANEL WITH TSH
Free Thyroxine Index: 3.2 (ref 1.4–3.8)
T3 Uptake: 34 % (ref 22–35)
T4, Total: 9.4 ug/dL (ref 5.1–11.9)
TSH: 0.51 mIU/L

## 2020-07-17 LAB — CBC
HCT: 44 % (ref 35.0–45.0)
Hemoglobin: 14.3 g/dL (ref 11.7–15.5)
MCH: 29.8 pg (ref 27.0–33.0)
MCHC: 32.5 g/dL (ref 32.0–36.0)
MCV: 91.7 fL (ref 80.0–100.0)
MPV: 9.4 fL (ref 7.5–12.5)
Platelets: 319 10*3/uL (ref 140–400)
RBC: 4.8 10*6/uL (ref 3.80–5.10)
RDW: 13 % (ref 11.0–15.0)
WBC: 7.7 10*3/uL (ref 3.8–10.8)

## 2020-07-17 LAB — VITAMIN D 25 HYDROXY (VIT D DEFICIENCY, FRACTURES): Vit D, 25-Hydroxy: 40 ng/mL (ref 30–100)

## 2020-07-22 ENCOUNTER — Other Ambulatory Visit: Payer: Self-pay | Admitting: Obstetrics & Gynecology

## 2020-07-22 DIAGNOSIS — E039 Hypothyroidism, unspecified: Secondary | ICD-10-CM

## 2020-07-24 NOTE — Telephone Encounter (Signed)
Pt saw Dr Talbert Nan 07/16/20 therefore changing refill to her name. Approval Sent.  TSH normal KW CMA

## 2020-08-24 ENCOUNTER — Encounter: Payer: Self-pay | Admitting: Obstetrics and Gynecology

## 2020-08-28 ENCOUNTER — Ambulatory Visit: Payer: 59

## 2020-10-05 ENCOUNTER — Other Ambulatory Visit: Payer: Self-pay | Admitting: Hematology and Oncology

## 2020-10-05 DIAGNOSIS — Z9889 Other specified postprocedural states: Secondary | ICD-10-CM

## 2020-10-05 DIAGNOSIS — Z1231 Encounter for screening mammogram for malignant neoplasm of breast: Secondary | ICD-10-CM

## 2020-10-24 ENCOUNTER — Other Ambulatory Visit: Payer: Self-pay

## 2020-10-24 ENCOUNTER — Ambulatory Visit
Admission: RE | Admit: 2020-10-24 | Discharge: 2020-10-24 | Disposition: A | Discharge status: Home / Self Care | Payer: BC Managed Care – PPO | Source: Ambulatory Visit | Attending: Hematology and Oncology | Admitting: Hematology and Oncology

## 2020-10-24 DIAGNOSIS — Z9889 Other specified postprocedural states: Secondary | ICD-10-CM

## 2021-05-08 ENCOUNTER — Other Ambulatory Visit: Payer: Self-pay

## 2021-05-08 DIAGNOSIS — E039 Hypothyroidism, unspecified: Secondary | ICD-10-CM

## 2021-05-08 MED ORDER — LEVOTHYROXINE SODIUM 125 MCG PO TABS: ORAL_TABLET | ORAL | 0 refills | Status: DC

## 2021-05-08 NOTE — Telephone Encounter (Signed)
Last AEX 07/16/20--scheduled for 07/18/21. ?TFTs checked 07/16/20-WNL ?

## 2021-05-08 NOTE — Telephone Encounter (Signed)
-----   Message from Leron Croak, [place] sent at 05/08/2021 11:21 AM EDT ----- ?Hey Ladies ?Pt has already scheduled annual for this year. She needs a refill on her levothyroxine. Thank you  ? ?

## 2021-07-04 NOTE — Progress Notes (Signed)
51 y.o. G32P1001 Divorced White or Caucasian Not Hispanic or Latino female here for annual exam.  No vaginal bleeding. No bowel or bladder c/o.  10/20 H/O left breast cancer, ER +, negative genetic testing S/P left lumpectomy, + node (they only took one node) S/p chemotherapy, radiation.  Counseled about tamoxifen in 7/21, didn't start it.    Stopped cycling with chemotherapy.  Hypothyroid, needs refill of synthroid  Sexually active, same partner 1.5 years. No STD concerns.     Patient's last menstrual period was 01/15/2019.          Sexually active: Yes.    The current method of family planning is none. Female partner   Exercising: No.   Smoker:  yes, 5-6 cigarettes a day.   Health Maintenance: Pap:  12-03-18 normal History of abnormal Pap:  no MMG:  10-24-20 normal Cat.B BiRADS 2 BMD:   N/A Colonoscopy: N/A TDaP: declined Gardasil: no   reports that she has been smoking cigarettes. She has never used smokeless tobacco. She reports current alcohol use of about 20.0 standard drinks of alcohol per week. She reports that she does not currently use drugs. 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, local  Past Medical History:  Diagnosis Date   Anxiety    Breast cancer ([place])    Left Breast   Family history of CLL (chronic lymphoid leukemia)    Hypoglycemia    Hypothyroidism    Migraines    Personal history of chemotherapy 03/01/2019   Personal history of radiation therapy 04/04/2019   Plantar fasciitis     Past Surgical History:  Procedure Laterality Date   BREAST BIOPSY     BREAST LUMPECTOMY Left 11/2018   invasive ductal carcinoma   BREAST LUMPECTOMY WITH RADIOACTIVE SEED AND SENTINEL LYMPH NODE BIOPSY Left 11/10/2018   Procedure: LEFT BREAST LUMPECTOMY WITH RADIOACTIVE SEED AND LEFT AXILLARY SENTINEL LYMPH NODE BIOPSY;  Surgeon: Rolm Bookbinder, MD;  Location: [facility];  Service: General;  Laterality: Left;   PORTACATH PLACEMENT Right 12/27/2018   Procedure: INSERTION  PORT-A-CATH WITH ULTRASOUND;  Surgeon: Rolm Bookbinder, MD;  Location: [facility];  Service: General;  Laterality: Right;    Current Outpatient Medications  Medication Sig Dispense Refill   escitalopram (LEXAPRO) 10 MG tablet Take by mouth.     fexofenadine (ALLEGRA ALLERGY) 60 MG tablet Take 1 tablet (60 mg total) by mouth 2 (two) times daily. 120 tablet 0   levothyroxine (SYNTHROID) 125 MCG tablet Take one tab po daily before breakfast. 90 tablet 0   ALPRAZolam (XANAX) 0.25 MG tablet TAKE 1 TABLET (0.25 MG TOTAL) BY MOUTH 2 (TWO) TIMES DAILY AS NEEDED FOR ANXIETY. (Patient not taking: Reported on 07/18/2021) 30 tablet 1   No current facility-administered medications for this visit.    Family History  Problem Relation Age of Onset   Thyroid disease Father    Leukemia Father 29       CLL   Heart attack Maternal Aunt    Heart disease Maternal Grandmother    Breast cancer Neg Hx     Review of Systems  Exam:   BP 130/80 (BP Location: Right Arm, Patient Position: Sitting, Cuff Size: Normal)   Pulse 78   Ht '5\' 2"'$  (1.575 m)   Wt 180 lb (81.6 kg)   LMP 01/15/2019   SpO2 98%   BMI 32.92 kg/m   Weight change: '@WEIGHTCHANGE'$ @ Height:   Height: '5\' 2"'$  (157.5 cm)  Ht Readings from Last 3 Encounters:  07/18/21 '5\' 2"'$  (1.575 m)  07/16/20 5' 1.75" (1.568 m)  08/05/19 5' 1.75" (1.568 m)    General appearance: alert, cooperative and appears stated age Head: Normocephalic, without obvious abnormality, atraumatic Neck: no adenopathy, supple, symmetrical, trachea midline and thyroid normal to inspection and palpation Lungs: clear to auscultation bilaterally Cardiovascular: regular rate and rhythm Breasts:  In the left breast at 9-10 o'clock there is a 1 cm, smooth mobile lump ~1 cm from the areolar region. No other lumps, left nipple is inverted and has evidence of lumpectomy in the upper outer quadrant Abdomen: soft, non-tender; non distended,  no masses,  no  organomegaly Extremities: extremities normal, atraumatic, no cyanosis or edema Skin: Skin color, texture, turgor normal. No rashes or lesions Lymph nodes: Cervical, supraclavicular, and axillary nodes normal. No abnormal inguinal nodes palpated Neurologic: Grossly normal   Pelvic: External genitalia:  no lesions              Urethra:  normal appearing urethra with no masses, tenderness or lesions              Bartholins and Skenes: normal                 Vagina: normal appearing vagina with normal color and discharge, no lesions              Cervix: no lesions               Bimanual Exam:  Uterus:   no masses or tenderness              Adnexa: no mass, fullness, tenderness               Rectovaginal: declines  Caryn Bee, CMA chaperoned for the exam.  1. Well woman exam  2. Malignant neoplasm of upper-outer quadrant of left breast in female, estrogen receptor positive ([place]) Discussed breast self exam Normal mammogram in 10/22  3. Breast lump on left side at 10 o'clock position Will set up diagnostic imaging on the left  4. Hypothyroidism, unspecified type Once labs are back will call in synthroid - Thyroid Panel With TSH  5. Colon cancer screening - Ambulatory referral to Gastroenterology  6. Laboratory exam ordered as part of routine general medical examination - Hepatic function panel  7. Alcohol consumption heavy Discussed recommendeations - Hepatic function panel  8. Screening for cervical cancer - Cytology - PAP

## 2021-07-08 DIAGNOSIS — M2022 Hallux rigidus, left foot: Secondary | ICD-10-CM | POA: Insufficient documentation

## 2021-07-08 DIAGNOSIS — M722 Plantar fascial fibromatosis: Secondary | ICD-10-CM | POA: Insufficient documentation

## 2021-07-18 ENCOUNTER — Telehealth: Payer: Self-pay

## 2021-07-18 ENCOUNTER — Other Ambulatory Visit (HOSPITAL_COMMUNITY)
Admission: RE | Admit: 2021-07-18 | Discharge: 2021-07-18 | Disposition: A | Discharge status: Home / Self Care | Payer: BC Managed Care – PPO | Source: Ambulatory Visit | Attending: Obstetrics and Gynecology | Admitting: Obstetrics and Gynecology

## 2021-07-18 ENCOUNTER — Ambulatory Visit (INDEPENDENT_AMBULATORY_CARE_PROVIDER_SITE_OTHER): Payer: BC Managed Care – PPO | Admitting: Obstetrics and Gynecology

## 2021-07-18 ENCOUNTER — Encounter: Payer: Self-pay | Admitting: Obstetrics and Gynecology

## 2021-07-18 VITALS — BP 130/80 | HR 78 | Ht 62.0 in | Wt 180.0 lb

## 2021-07-18 DIAGNOSIS — E039 Hypothyroidism, unspecified: Secondary | ICD-10-CM

## 2021-07-18 DIAGNOSIS — N6320 Unspecified lump in the left breast, unspecified quadrant: Secondary | ICD-10-CM

## 2021-07-18 DIAGNOSIS — Z124 Encounter for screening for malignant neoplasm of cervix: Secondary | ICD-10-CM | POA: Insufficient documentation

## 2021-07-18 DIAGNOSIS — Z170 Estrogen receptor positive status [ER+]: Secondary | ICD-10-CM

## 2021-07-18 DIAGNOSIS — C50412 Malignant neoplasm of upper-outer quadrant of left female breast: Secondary | ICD-10-CM

## 2021-07-18 DIAGNOSIS — Z1211 Encounter for screening for malignant neoplasm of colon: Secondary | ICD-10-CM

## 2021-07-18 DIAGNOSIS — Z01419 Encounter for gynecological examination (general) (routine) without abnormal findings: Secondary | ICD-10-CM | POA: Diagnosis not present

## 2021-07-18 DIAGNOSIS — Z789 Other specified health status: Secondary | ICD-10-CM

## 2021-07-18 DIAGNOSIS — N6322 Unspecified lump in the left breast, upper inner quadrant: Secondary | ICD-10-CM | POA: Diagnosis not present

## 2021-07-18 DIAGNOSIS — Z0000 Encounter for general adult medical examination without abnormal findings: Secondary | ICD-10-CM

## 2021-07-18 NOTE — Telephone Encounter (Signed)
Salvadore Dom, MD  P [facility] Triage Please schedule her for diagnostic left breast imaging. New lump at 9-10 o'clock, h/o breast cancer.  Thanks

## 2021-07-18 NOTE — Telephone Encounter (Signed)
Order placed. Scheduled patient for Friday 08/02/21 at 11:00am at [facility].  Advised her that she can call for cancellation.  My Chart message sent to her with all this info as she was driving.

## 2021-07-18 NOTE — Patient Instructions (Signed)

## 2021-07-19 ENCOUNTER — Other Ambulatory Visit: Payer: Self-pay | Admitting: Obstetrics and Gynecology

## 2021-07-19 DIAGNOSIS — E039 Hypothyroidism, unspecified: Secondary | ICD-10-CM

## 2021-07-19 LAB — HEPATIC FUNCTION PANEL
AG Ratio: 1.8 (calc) (ref 1.0–2.5)
ALT: 12 U/L (ref 6–29)
AST: 10 U/L (ref 10–35)
Albumin: 4.4 g/dL (ref 3.6–5.1)
Alkaline phosphatase (APISO): 56 U/L (ref 31–125)
Bilirubin, Direct: 0.1 mg/dL (ref 0.0–0.2)
Globulin: 2.5 g/dL (calc) (ref 1.9–3.7)
Indirect Bilirubin: 0.4 mg/dL (calc) (ref 0.2–1.2)
Total Bilirubin: 0.5 mg/dL (ref 0.2–1.2)
Total Protein: 6.9 g/dL (ref 6.1–8.1)

## 2021-07-19 LAB — THYROID PANEL WITH TSH
Free Thyroxine Index: 3.7 (ref 1.4–3.8)
T3 Uptake: 38 % — ABNORMAL HIGH (ref 22–35)
T4, Total: 9.8 ug/dL (ref 5.1–11.9)
TSH: 0.5 m[IU]/L

## 2021-07-19 MED ORDER — LEVOTHYROXINE SODIUM 125 MCG PO TABS
ORAL_TABLET | ORAL | 3 refills | Status: DC
Start: 2021-07-19 — End: 2021-08-13

## 2021-07-24 LAB — CYTOLOGY - PAP
Comment: NEGATIVE
Diagnosis: UNDETERMINED — AB
High risk HPV: NEGATIVE

## 2021-08-02 ENCOUNTER — Ambulatory Visit
Admission: RE | Admit: 2021-08-02 | Discharge: 2021-08-02 | Disposition: A | Discharge status: Home / Self Care | Payer: BC Managed Care – PPO | Source: Ambulatory Visit | Attending: Obstetrics and Gynecology | Admitting: Obstetrics and Gynecology

## 2021-08-02 DIAGNOSIS — N6320 Unspecified lump in the left breast, unspecified quadrant: Secondary | ICD-10-CM

## 2021-08-13 ENCOUNTER — Other Ambulatory Visit: Payer: Self-pay

## 2021-08-13 DIAGNOSIS — Z1211 Encounter for screening for malignant neoplasm of colon: Secondary | ICD-10-CM

## 2021-08-13 DIAGNOSIS — E039 Hypothyroidism, unspecified: Secondary | ICD-10-CM

## 2021-08-13 MED ORDER — LEVOTHYROXINE SODIUM 125 MCG PO TABS: ORAL_TABLET | ORAL | 3 refills | Status: DC

## 2021-11-12 ENCOUNTER — Other Ambulatory Visit: Payer: Self-pay | Admitting: Obstetrics and Gynecology

## 2021-11-12 DIAGNOSIS — Z1231 Encounter for screening mammogram for malignant neoplasm of breast: Secondary | ICD-10-CM

## 2021-11-14 ENCOUNTER — Ambulatory Visit
Admission: RE | Admit: 2021-11-14 | Discharge: 2021-11-14 | Disposition: A | Discharge status: Home / Self Care | Payer: BC Managed Care – PPO | Source: Ambulatory Visit | Attending: Obstetrics and Gynecology | Admitting: Obstetrics and Gynecology

## 2021-11-14 DIAGNOSIS — Z1231 Encounter for screening mammogram for malignant neoplasm of breast: Secondary | ICD-10-CM

## 2021-11-19 ENCOUNTER — Encounter: Payer: Self-pay | Admitting: Obstetrics and Gynecology

## 2021-11-19 DIAGNOSIS — E039 Hypothyroidism, unspecified: Secondary | ICD-10-CM

## 2021-11-21 NOTE — Telephone Encounter (Signed)
Please call in the brand of synthroid that she requests to the pharmacy she desires (see mychart messages). Please call in 90 with 2 refills

## 2021-11-22 MED ORDER — LEVOTHYROXINE SODIUM 125 MCG PO TABS: ORAL_TABLET | ORAL | 2 refills | Status: DC

## 2021-11-22 NOTE — Addendum Note (Signed)
Addended by: Judy Pimple D on: 11/22/2021 08:09 AM   Modules accepted: Orders

## 2022-07-21 ENCOUNTER — Ambulatory Visit: Payer: BC Managed Care – PPO | Admitting: Nurse Practitioner

## 2022-07-21 ENCOUNTER — Ambulatory Visit: Payer: BC Managed Care – PPO | Admitting: Obstetrics and Gynecology

## 2022-09-10 ENCOUNTER — Other Ambulatory Visit: Payer: Self-pay

## 2022-09-10 DIAGNOSIS — E039 Hypothyroidism, unspecified: Secondary | ICD-10-CM

## 2022-09-10 NOTE — Telephone Encounter (Signed)
Former JJ pt LVM in triage line requesting refill on medication. Also mentioned that she is without insurance for a few weeks and is requesting only a 30day prescription be sent instead of 90.   Med refill request: Synthroid Last AEX: 07/18/2021 Next AEX: nothing currently scheduled Last TSH checked on 05/28/2022: 1.312 Refill authorized: rx pend.  Msg sent to appt desk to schedule AEX.

## 2022-09-11 MED ORDER — LEVOTHYROXINE SODIUM 125 MCG PO TABS: ORAL_TABLET | ORAL | 0 refills | Status: DC

## 2022-09-11 NOTE — Telephone Encounter (Signed)
Pt now scheduled for AEX on 11/21/2022 w/ JC

## 2022-10-13 ENCOUNTER — Other Ambulatory Visit: Payer: Self-pay

## 2022-10-13 DIAGNOSIS — E039 Hypothyroidism, unspecified: Secondary | ICD-10-CM

## 2022-10-14 ENCOUNTER — Other Ambulatory Visit: Payer: Self-pay | Admitting: Internal Medicine

## 2022-10-14 ENCOUNTER — Other Ambulatory Visit: Payer: Self-pay

## 2022-10-14 ENCOUNTER — Other Ambulatory Visit: Payer: Self-pay | Admitting: Radiology

## 2022-10-14 DIAGNOSIS — E039 Hypothyroidism, unspecified: Secondary | ICD-10-CM

## 2022-10-14 DIAGNOSIS — Z1231 Encounter for screening mammogram for malignant neoplasm of breast: Secondary | ICD-10-CM

## 2022-10-14 MED ORDER — LEVOTHYROXINE SODIUM 125 MCG PO TABS: ORAL_TABLET | ORAL | 1 refills | Status: DC

## 2022-10-14 NOTE — Telephone Encounter (Signed)
Med refill request: levothyroxine Last AEX: 07/18/21 Next AEX: 11/21/22 with Jami Last MMG (if hormonal med) n/a Refill request is a duplicate, already sent to Dr. Karma Greaser for approval.

## 2022-10-14 NOTE — Telephone Encounter (Signed)
Med refill request: levothyroxine Last AEX: 07/18/21 Dr. Oscar La Next AEX: 11/21/22 with Jami Last MMG (if hormonal med) n/a Refill sent to provider for approval

## 2022-10-23 NOTE — Telephone Encounter (Signed)
Review med refill encounters dated 10/14/2022. Closing this encounter.

## 2022-11-04 NOTE — Telephone Encounter (Signed)
TC

## 2022-11-21 ENCOUNTER — Encounter: Payer: BC Managed Care – PPO | Admitting: Radiology

## 2022-11-21 ENCOUNTER — Ambulatory Visit: Payer: BLUE CROSS/BLUE SHIELD

## 2022-11-21 ENCOUNTER — Ambulatory Visit: Payer: BC Managed Care – PPO | Admitting: Radiology

## 2022-11-21 ENCOUNTER — Ambulatory Visit: Payer: Self-pay | Admitting: Radiology

## 2022-11-25 NOTE — Progress Notes (Signed)
cancelled

## 2022-12-16 ENCOUNTER — Other Ambulatory Visit: Payer: Self-pay | Admitting: Obstetrics and Gynecology

## 2022-12-16 DIAGNOSIS — E039 Hypothyroidism, unspecified: Secondary | ICD-10-CM

## 2022-12-16 NOTE — Telephone Encounter (Signed)
Medication refill request: synthroid Last AEX:  07-18-21 Next AEX: 01-02-23 Last MMG (if hormonal medication request): n/a Refill authorized: please approve if appropriate

## 2023-01-02 ENCOUNTER — Ambulatory Visit: Payer: Self-pay | Admitting: Radiology
